# Patient Record
Sex: Female | Born: 1975
Health system: Southern US, Community
[De-identification: ages and names within clinical notes are randomized; demographics above are authoritative.]

## PROBLEM LIST (undated history)

## (undated) DIAGNOSIS — R519 Headache, unspecified: Secondary | ICD-10-CM

## (undated) DIAGNOSIS — R51 Headache: Secondary | ICD-10-CM

## (undated) DIAGNOSIS — M4800 Spinal stenosis, site unspecified: Secondary | ICD-10-CM

## (undated) HISTORY — PX: MOUTH SURGERY: SHX715

## (undated) HISTORY — DX: Spinal stenosis, site unspecified: M48.00

---

## 2003-11-30 ENCOUNTER — Other Ambulatory Visit: Admission: RE | Admit: 2003-11-30 | Discharge: 2003-11-30 | Payer: Self-pay | Admitting: Obstetrics & Gynecology

## 2003-11-30 ENCOUNTER — Other Ambulatory Visit: Admission: RE | Admit: 2003-11-30 | Discharge: 2003-11-30 | Payer: Self-pay | Admitting: Gastroenterology

## 2004-12-06 ENCOUNTER — Other Ambulatory Visit: Admission: RE | Admit: 2004-12-06 | Discharge: 2004-12-06 | Payer: Self-pay | Admitting: Obstetrics & Gynecology

## 2004-12-07 ENCOUNTER — Other Ambulatory Visit: Admission: RE | Admit: 2004-12-07 | Discharge: 2004-12-07 | Payer: Self-pay | Admitting: Obstetrics & Gynecology

## 2005-12-12 ENCOUNTER — Other Ambulatory Visit: Admission: RE | Admit: 2005-12-12 | Discharge: 2005-12-12 | Payer: Self-pay | Admitting: Obstetrics & Gynecology

## 2009-04-28 ENCOUNTER — Encounter: Admission: RE | Admit: 2009-04-28 | Discharge: 2009-04-28 | Payer: Self-pay | Admitting: Internal Medicine

## 2012-03-21 ENCOUNTER — Emergency Department (HOSPITAL_COMMUNITY)
Admission: EM | Admit: 2012-03-21 | Discharge: 2012-03-21 | Disposition: A | Discharge status: Home / Self Care | Payer: BC Managed Care – PPO | Attending: Emergency Medicine | Admitting: Emergency Medicine

## 2012-03-21 ENCOUNTER — Encounter (HOSPITAL_COMMUNITY): Payer: Self-pay | Admitting: *Deleted

## 2012-03-21 DIAGNOSIS — IMO0001 Reserved for inherently not codable concepts without codable children: Secondary | ICD-10-CM | POA: Insufficient documentation

## 2012-03-21 DIAGNOSIS — R51 Headache: Secondary | ICD-10-CM | POA: Insufficient documentation

## 2012-03-21 DIAGNOSIS — R5383 Other fatigue: Secondary | ICD-10-CM | POA: Insufficient documentation

## 2012-03-21 DIAGNOSIS — R42 Dizziness and giddiness: Secondary | ICD-10-CM | POA: Insufficient documentation

## 2012-03-21 DIAGNOSIS — I959 Hypotension, unspecified: Secondary | ICD-10-CM | POA: Insufficient documentation

## 2012-03-21 DIAGNOSIS — R5381 Other malaise: Secondary | ICD-10-CM | POA: Insufficient documentation

## 2012-03-21 HISTORY — DX: Headache, unspecified: R51.9

## 2012-03-21 HISTORY — DX: Headache: R51

## 2012-03-21 LAB — CBC WITH DIFFERENTIAL/PLATELET
Basophils Absolute: 0 10*3/uL (ref 0.0–0.1)
Eosinophils Relative: 1 % (ref 0–5)
HCT: 39.4 % (ref 36.0–46.0)
Hemoglobin: 12.9 g/dL (ref 12.0–15.0)
Lymphocytes Relative: 34 % (ref 12–46)
MCHC: 32.7 g/dL (ref 30.0–36.0)
MCV: 84.5 fL (ref 78.0–100.0)
Monocytes Absolute: 0.6 10*3/uL (ref 0.1–1.0)
Monocytes Relative: 9 % (ref 3–12)
Neutro Abs: 3.6 10*3/uL (ref 1.7–7.7)
RDW: 13.8 % (ref 11.5–15.5)
WBC: 6.5 10*3/uL (ref 4.0–10.5)

## 2012-03-21 LAB — URINALYSIS, ROUTINE W REFLEX MICROSCOPIC
Bilirubin Urine: NEGATIVE
Glucose, UA: NEGATIVE mg/dL
Ketones, ur: NEGATIVE mg/dL
Specific Gravity, Urine: 1.024 (ref 1.005–1.030)
pH: 7 (ref 5.0–8.0)

## 2012-03-21 LAB — POCT I-STAT, CHEM 8
BUN: 5 mg/dL — ABNORMAL LOW (ref 6–23)
Calcium, Ion: 1.25 mmol/L — ABNORMAL HIGH (ref 1.12–1.23)
Chloride: 103 mEq/L (ref 96–112)
Creatinine, Ser: 1 mg/dL (ref 0.50–1.10)
Glucose, Bld: 104 mg/dL — ABNORMAL HIGH (ref 70–99)
HCT: 42 % (ref 36.0–46.0)
Potassium: 3.6 mEq/L (ref 3.5–5.1)

## 2012-03-21 LAB — URINE MICROSCOPIC-ADD ON

## 2012-03-21 MED ORDER — IBUPROFEN 800 MG PO TABS
800.0000 mg | ORAL_TABLET | Freq: Once | ORAL | Status: AC
  Administered 2012-03-21: 800 mg via ORAL
  Filled 2012-03-21: qty 1

## 2012-03-21 NOTE — ED Notes (Signed)
Pt given something to drink and crackers to eat prior to receiving ibuprofen.

## 2012-03-21 NOTE — ED Notes (Signed)
Pt sent here from [facility] with Hypotension, states she has had a headache since Sunday with some generalized weakness, cramping and worsening head ache this morning

## 2012-03-21 NOTE — ED Notes (Signed)
Pt sent here from [facility] bc of hypotension, dizziness, weakness in hands, and headache since Sunday. States that Dr. Charline Bills that further testing was needed.

## 2012-03-21 NOTE — ED Provider Notes (Signed)
History     CSN: [id number]  Arrival date & time 03/21/12  1331   First MD Initiated Contact with Patient 03/21/12 1400      Chief Complaint  Patient presents with  . Hypotension  . Dizziness  . Headache    (Consider location/radiation/quality/duration/timing/severity/associated sxs/prior treatment) HPI Comments: Patient with no significant PMH, presents with multiple complaints. She has had HA for 4 days that is located in the occiput and is dull, does not radiate, is not associated with photo/phonophobia or fever. She has had nausea but no vomiting. No trouble moving neck. She also complains of intermittent lightheadedness. She has not passed out. Sometimes she sees 'spots'. She has been drinking normally. She also has had calf and forearm pain bilaterally. She has had episodes of this in the past but does not recall for how long. She states that she feels weak in her arms. She has taken 'generic headache reliever' without relief of symptoms. Onset gradual, course is constant. Patient went to the [facility] today and was sent to ED when her blood pressure was found to be low. No N/V/D, blood in urine or stool, heavy vaginal bleeding.   Patient is a 36 y.o. female presenting with headaches. The history is provided by the patient.  Headache  This is a new problem. The current episode started more than 2 days ago. The problem occurs constantly. The problem has not changed since onset.The pain is located in the occipital region. The quality of the pain is described as dull. The pain is moderate. The pain does not radiate. Pertinent negatives include no fever, no nausea and no vomiting.    Past Medical History  Diagnosis Date  . Worsening headaches     History reviewed. No pertinent past surgical history.  No family history on file.  History  Substance Use Topics  . Smoking status: Never Smoker   . Smokeless tobacco: Not on file  . Alcohol Use: No    OB History    Grav Para  Term Preterm Abortions TAB SAB Ect Mult Living                  Review of Systems  Constitutional: Negative for fever.  HENT: Negative for sore throat and rhinorrhea.   Eyes: Negative for redness.  Respiratory: Negative for cough.   Cardiovascular: Negative for chest pain.  Gastrointestinal: Negative for nausea, vomiting, abdominal pain and diarrhea.  Genitourinary: Negative for dysuria, hematuria and vaginal bleeding.  Musculoskeletal: Positive for myalgias. Negative for arthralgias.  Skin: Negative for rash.  Neurological: Positive for weakness, light-headedness and headaches. Negative for syncope.    Allergies  Review of patient's allergies indicates no known allergies.  Home Medications  No current outpatient prescriptions on file.  BP 117/67  Pulse 112  Temp 98.5 F (36.9 C) (Oral)  Resp 15  SpO2 98%  LMP 03/04/2012  Physical Exam  Nursing note and vitals reviewed. Constitutional: She appears well-developed and well-nourished.  HENT:  Head: Normocephalic and atraumatic.  Eyes: Conjunctivae are normal. Right eye exhibits no discharge. Left eye exhibits no discharge.  Neck: Normal range of motion. Neck supple.  Cardiovascular: Normal rate, regular rhythm and normal heart sounds.   No murmur heard. Pulmonary/Chest: Effort normal and breath sounds normal. No respiratory distress. She has no wheezes.  Abdominal: Soft. She exhibits no distension. There is no tenderness. There is no rebound and no guarding.  Musculoskeletal: Normal range of motion. She exhibits tenderness. She exhibits no edema.  Diffuse tenderness to palpation of forearms and calves. Sensation normal.   Neurological: She is alert. No cranial nerve deficit or sensory deficit. She exhibits normal muscle tone. Coordination normal.  Skin: Skin is warm and dry.  Psychiatric: She has a normal mood and affect.    ED Course  Procedures (including critical care time)  Labs Reviewed  URINALYSIS,  ROUTINE W REFLEX MICROSCOPIC - Abnormal; Notable for the following:    APPearance CLOUDY (*)     Hgb urine dipstick SMALL (*)     All other components within normal limits  POCT I-STAT, CHEM 8 - Abnormal; Notable for the following:    BUN 5 (*)     Glucose, Bld 104 (*)     Calcium, Ion 1.25 (*)     All other components within normal limits  CBC WITH DIFFERENTIAL  POCT PREGNANCY, URINE  URINE MICROSCOPIC-ADD ON   No results found.   1. Lightheadedness   2. HA (headache)     2:27 PM Patient seen and examined. Work-up initiated.    Vital signs reviewed and are as follows: Filed Vitals:   03/21/12 1346  BP: 117/67  Pulse: 112  Temp: 98.5 F (36.9 C)  Resp: 15   4:18 PM Results and history reviewed with Dr. Juleen China. Patient informed of results. She appears well. Her HA continues, ibuprofen ordered. She has PCP follow-up. Urged PCP follow-up with return if worsening. She verbalizes understanding and agrees with plan.   MDM  HA: no head injury, neurological exam is unconcerning. HA is not thunderclap or worst of life. Do not suspect intracranial etiology. Treat conservatively for now, follow-up with PCP if persistent or recurring.  Hypotension: not demonstrated in ED. Not orthostatic.   Lightheadedness: no clinical dehydration, symptoms mild, no sudden onset or palpitations, do not suspect cardiac etiology or arrhythmia.   Myalgias: unclear etiology. Strength intact. No viral syndrome. Patient to follow-up with PCP, especially since she has had multiple similar episodes in the past.   No life-threatening or dangerous medical conditions suspected at this time. Patient is stable at time of discharge and appears well.       Renne Crigler, [place] 03/21/12 [phone]

## 2012-03-25 NOTE — ED Provider Notes (Signed)
Medical screening examination/treatment/procedure(s) were performed by non-physician practitioner and as supervising physician I was immediately available for consultation/collaboration.    Chanoch Mccleery R Fawnda Vitullo, MD 03/25/12 1406 

## 2015-01-26 ENCOUNTER — Other Ambulatory Visit: Payer: Self-pay | Admitting: Family Medicine

## 2015-01-26 DIAGNOSIS — R5383 Other fatigue: Secondary | ICD-10-CM

## 2015-02-01 ENCOUNTER — Ambulatory Visit
Admission: RE | Admit: 2015-02-01 | Discharge: 2015-02-01 | Disposition: A | Discharge status: Home / Self Care | Payer: BC Managed Care – PPO | Source: Ambulatory Visit | Attending: Family Medicine | Admitting: Family Medicine

## 2015-02-01 DIAGNOSIS — R5383 Other fatigue: Secondary | ICD-10-CM

## 2017-04-06 ENCOUNTER — Other Ambulatory Visit: Payer: Self-pay | Admitting: Obstetrics & Gynecology

## 2017-04-06 DIAGNOSIS — R928 Other abnormal and inconclusive findings on diagnostic imaging of breast: Secondary | ICD-10-CM

## 2017-04-12 ENCOUNTER — Ambulatory Visit
Admission: RE | Admit: 2017-04-12 | Discharge: 2017-04-12 | Disposition: A | Discharge status: Home / Self Care | Payer: BC Managed Care – PPO | Source: Ambulatory Visit | Attending: Obstetrics & Gynecology | Admitting: Obstetrics & Gynecology

## 2017-04-12 DIAGNOSIS — R928 Other abnormal and inconclusive findings on diagnostic imaging of breast: Secondary | ICD-10-CM

## 2017-08-06 ENCOUNTER — Encounter (INDEPENDENT_AMBULATORY_CARE_PROVIDER_SITE_OTHER): Payer: Self-pay

## 2017-08-06 ENCOUNTER — Encounter: Payer: Self-pay | Admitting: Neurology

## 2017-08-06 ENCOUNTER — Ambulatory Visit: Payer: BC Managed Care – PPO | Admitting: Neurology

## 2017-08-06 VITALS — BP 102/68 | HR 90 | Ht 65.0 in | Wt 252.2 lb

## 2017-08-06 DIAGNOSIS — G43709 Chronic migraine without aura, not intractable, without status migrainosus: Secondary | ICD-10-CM | POA: Diagnosis not present

## 2017-08-06 DIAGNOSIS — IMO0002 Reserved for concepts with insufficient information to code with codable children: Secondary | ICD-10-CM | POA: Insufficient documentation

## 2017-08-06 MED ORDER — RIZATRIPTAN BENZOATE 10 MG PO TBDP: 10.0000 mg | ORAL_TABLET | ORAL | 11 refills | Status: AC | PRN

## 2017-08-06 MED ORDER — ONDANSETRON 4 MG PO TBDP: 4.0000 mg | ORAL_TABLET | Freq: Three times a day (TID) | ORAL | 11 refills | Status: AC | PRN

## 2017-08-06 NOTE — Progress Notes (Signed)
PATIENT: Vanessa Stafford DOB: 09/23/1975  Chief Complaint  Patient presents with  . Migraine    Reports frequent headaches often associated with dizziness, nausea, vomiting and sometimes weakness.  At times, she will wake up with the pain. She has between 3-4 events each month.  She has only had minimal relief with OTC NSAIDS, cyclobenzaprine and nabumetone.  She is currently only using meclizine and Extra Strength Tylenol prn.  Marland Kitchen. PCP    Rankins, Fanny DanceVictoria R, MD     HISTORICAL  Vanessa Stafford is a 41 years old female, seen in refer by her primary care doctor Rankins, [place] R for evaluation of migraine, initial evaluation was on August 06 2017    she reported a history of migraine headaches since high school, her typical migraine are lateralized severe pounding headache with associated light noise sensitivity, lasting for a few hours, increased frequency since 2018, about 4 times each months, trigger for her headaches are menstruation weather change stress, sleep deprivation,   She tried over-the-counter Tylenol or ibuprofen with limited help, was getting prescription of Flexeril, nabumetone, she could not tolerate it. She has never tried triptan in the past,    Laboratory evaluations, LDL was 148, cholesterol was 227, normal CBC, hemoglobin of 12.2, TSH,  CT head without contrast on Feb 01 2015 was no acute abnormalities  REVIEW OF SYSTEMS: Full 14 system review of systems performed and notable only for headache numbness weakness dizziness not enough sleep decreased energy disinteresting activities, feeling hot, joint pain, cramps, achy muscles, allergy, blurry vision, weight gain, fatigue, chest pain, palpitation, ringing ears, spinning sensation, rash, itching   ALLERGIES: No Known Allergies  HOME MEDICATIONS: Current Outpatient Medications  Medication Sig Dispense Refill  . Acetaminophen (TYLENOL EXTRA STRENGTH PO) Take as needed by mouth.    . meclizine (ANTIVERT) 25 MG  tablet Take 25 mg as needed by mouth.     No current facility-administered medications for this visit.     PAST MEDICAL HISTORY: Past Medical History:  Diagnosis Date  . Spinal stenosis   . Worsening headaches     PAST SURGICAL HISTORY: Past Surgical History:  Procedure Laterality Date  . MOUTH SURGERY      FAMILY HISTORY: Family History  Problem Relation Age of Onset  . Diabetes Mother   . Cataracts Mother   . Arthritis Father   . Hypertension Maternal Grandmother     SOCIAL HISTORY:  Social History   Socioeconomic History  . Marital status: Single    Spouse name: Not on file  . Number of children: 0  . Years of education: 16  . Highest education level: Bachelor's degree (e.g., BA, AB, BS)  Social Needs  . Financial resource strain: Not on file  . Food insecurity - worry: Not on file  . Food insecurity - inability: Not on file  . Transportation needs - medical: Not on file  . Transportation needs - non-medical: Not on file  Occupational History  . Occupation: Furniture conservator/restorerAdministrative Support  Tobacco Use  . Smoking status: Never Smoker  . Smokeless tobacco: Never Used  Substance and Sexual Activity  . Alcohol use: Yes    Comment: 1-2 drinks occasionally - social  . Drug use: No  . Sexual activity: Yes    Birth control/protection: None  Other Topics Concern  . Not on file  Social History Narrative   Lives at home alone.   Left-handed.   Occasionally use of caffeine.     PHYSICAL  EXAM   Vitals:   08/06/17 1509  BP: 102/68  Pulse: 90  Weight: 252 lb 4 oz (114.4 kg)  Height: 5\' 5"  (1.651 m)    Not recorded      Body mass index is 41.98 kg/m.  PHYSICAL EXAMNIATION:  Gen: NAD, conversant, well nourised, obese, well groomed                     Cardiovascular: Regular rate rhythm, no peripheral edema, warm, nontender. Eyes: Conjunctivae clear without exudates or hemorrhage Neck: Supple, no carotid bruits. Pulmonary: Clear to auscultation bilaterally    NEUROLOGICAL EXAM:  MENTAL STATUS: Speech:    Speech is normal; fluent and spontaneous with normal comprehension.  Cognition:     Orientation to time, place and person     Normal recent and remote memory     Normal Attention span and concentration     Normal Language, naming, repeating,spontaneous speech     Fund of knowledge   CRANIAL NERVES: CN II: Visual fields are full to confrontation. Fundoscopic exam is normal with sharp discs and no vascular changes. Pupils are round equal and briskly reactive to light. CN III, IV, VI: extraocular movement are normal. No ptosis. CN V: Facial sensation is intact to pinprick in all 3 divisions bilaterally. Corneal responses are intact.  CN VII: Face is symmetric with normal eye closure and smile. CN VIII: Hearing is normal to rubbing fingers CN IX, X: Palate elevates symmetrically. Phonation is normal. CN XI: Head turning and shoulder shrug are intact CN XII: Tongue is midline with normal movements and no atrophy.  MOTOR: There is no pronator drift of out-stretched arms. Muscle bulk and tone are normal. Muscle strength is normal.  REFLEXES: Reflexes are 2+ and symmetric at the biceps, triceps, knees, and ankles. Plantar responses are flexor.  SENSORY: Intact to light touch, pinprick, positional sensation and vibratory sensation are intact in fingers and toes.  COORDINATION: Rapid alternating movements and fine finger movements are intact. There is no dysmetria on finger-to-nose and heel-knee-shin.    GAIT/STANCE: Posture is normal. Gait is steady with normal steps, base, arm swing, and turning. Heel and toe walking are normal. Tandem gait is normal.  Romberg is absent.   DIAGNOSTIC DATA (LABS, IMAGING, TESTING) - I reviewed patient records, labs, notes, testing and imaging myself where available.   ASSESSMENT AND PLAN  Vanessa Stafford is a 41 y.o. female    Chronic migraine headaches   She does not want to go on preventive  medications  Maxalt 10 mg as needed  Zofran 4 mg dissolvable as needed    Levert FeinsteinYijun Jerian Morais, M.D. Ph.D.  [facility] [address], [address] [place], [place] [address] Ph: [phone]) [phone] Fax: [phone]  CC: Rankins, Fanny DanceVictoria R, MD

## 2017-10-29 ENCOUNTER — Ambulatory Visit: Payer: BC Managed Care – PPO | Admitting: Neurology

## 2017-10-29 ENCOUNTER — Encounter: Payer: Self-pay | Admitting: Neurology

## 2017-10-29 VITALS — BP 117/72 | HR 95 | Ht 65.0 in | Wt 255.0 lb

## 2017-10-29 DIAGNOSIS — G43709 Chronic migraine without aura, not intractable, without status migrainosus: Secondary | ICD-10-CM

## 2017-10-29 DIAGNOSIS — IMO0002 Reserved for concepts with insufficient information to code with codable children: Secondary | ICD-10-CM

## 2017-10-29 NOTE — Progress Notes (Signed)
PATIENT: Vanessa Stafford DOB: 09/21/1975  Chief Complaint  Patient presents with  . Migraine    She has noticed an increase in migraines, having at least one per week. She always gets one around her menstrual cycle. She is overwhelmed/stressed at work. Rizatriptan is helpful and twelve tablets has lasted her three months.       HISTORICAL  Vanessa Axendrea L Ansell is a 42 years old female, seen in refer by her primary care doctor Rankins, [place] R for evaluation of migraine, initial evaluation was on August 06 2017    she reported a history of migraine headaches since high school, her typical migraine are lateralized severe pounding headache with associated light noise sensitivity, lasting for a few hours, increased frequency since 2018, about 4 times each months, trigger for her headaches are menstruation weather change stress, sleep deprivation,   She tried over-the-counter Tylenol or ibuprofen with limited help, was getting prescription of Flexeril, nabumetone, she could not tolerate it. She has never tried triptan in the past,   Laboratory evaluations, LDL was 148, cholesterol was 227, normal CBC, hemoglobin of 12.2, TSH,  CT head without contrast on Feb 01 2015 was no acute abnormalities  UPDATE Oct 29 2017: She felt overwhelmed all the time, she works [employer], complains of excessive stress, Maxalt has helped her headache,  REVIEW OF SYSTEMS: Full 14 system review of systems performed and notable only for fatigue, constipation, headaches, joint pain, back pain, neck pain  ALLERGIES: No Known Allergies  HOME MEDICATIONS: Current Outpatient Medications  Medication Sig Dispense Refill  . Acetaminophen (TYLENOL EXTRA STRENGTH PO) Take as needed by mouth.    . meclizine (ANTIVERT) 25 MG tablet Take 25 mg as needed by mouth.    . ondansetron (ZOFRAN ODT) 4 MG disintegrating tablet Take 1 tablet (4 mg total) every 8 (eight) hours as needed by mouth. 20 tablet 11  . rizatriptan  (MAXALT-MLT) 10 MG disintegrating tablet Take 1 tablet (10 mg total) as needed by mouth. May repeat in 2 hours if needed 12 tablet 11   No current facility-administered medications for this visit.     PAST MEDICAL HISTORY: Past Medical History:  Diagnosis Date  . Spinal stenosis   . Worsening headaches     PAST SURGICAL HISTORY: Past Surgical History:  Procedure Laterality Date  . MOUTH SURGERY      FAMILY HISTORY: Family History  Problem Relation Age of Onset  . Diabetes Mother   . Cataracts Mother   . Arthritis Father   . Hypertension Maternal Grandmother     SOCIAL HISTORY:  Social History   Socioeconomic History  . Marital status: Single    Spouse name: Not on file  . Number of children: 0  . Years of education: 16  . Highest education level: Bachelor's degree (e.g., BA, AB, BS)  Social Needs  . Financial resource strain: Not on file  . Food insecurity - worry: Not on file  . Food insecurity - inability: Not on file  . Transportation needs - medical: Not on file  . Transportation needs - non-medical: Not on file  Occupational History  . Occupation: Furniture conservator/restorerAdministrative Support  Tobacco Use  . Smoking status: Never Smoker  . Smokeless tobacco: Never Used  Substance and Sexual Activity  . Alcohol use: Yes    Comment: 1-2 drinks occasionally - social  . Drug use: No  . Sexual activity: Yes    Birth control/protection: None  Other Topics Concern  . Not  on file  Social History Narrative   Lives at home alone.   Left-handed.   Occasionally use of caffeine.     PHYSICAL EXAM   Vitals:   10/29/17 1531  BP: 117/72  Pulse: 95  Weight: 255 lb (115.7 kg)  Height: 5\' 5"  (1.651 m)    Not recorded      Body mass index is 42.43 kg/m.  PHYSICAL EXAMNIATION:  Gen: NAD, conversant, well nourised, obese, well groomed                     Cardiovascular: Regular rate rhythm, no peripheral edema, warm, nontender. Eyes: Conjunctivae clear without exudates or  hemorrhage Neck: Supple, no carotid bruits. Pulmonary: Clear to auscultation bilaterally   NEUROLOGICAL EXAM:  MENTAL STATUS: Speech:    Speech is normal; fluent and spontaneous with normal comprehension.  Cognition:     Orientation to time, place and person     Normal recent and remote memory     Normal Attention span and concentration     Normal Language, naming, repeating,spontaneous speech     Fund of knowledge   CRANIAL NERVES: CN II: Visual fields are full to confrontation. Fundoscopic exam is normal with sharp discs and no vascular changes. Pupils are round equal and briskly reactive to light. CN III, IV, VI: extraocular movement are normal. No ptosis. CN V: Facial sensation is intact to pinprick in all 3 divisions bilaterally. Corneal responses are intact.  CN VII: Face is symmetric with normal eye closure and smile. CN VIII: Hearing is normal to rubbing fingers CN IX, X: Palate elevates symmetrically. Phonation is normal. CN XI: Head turning and shoulder shrug are intact CN XII: Tongue is midline with normal movements and no atrophy.  MOTOR: There is no pronator drift of out-stretched arms. Muscle bulk and tone are normal. Muscle strength is normal.  REFLEXES: Reflexes are 2+ and symmetric at the biceps, triceps, knees, and ankles. Plantar responses are flexor.  SENSORY: Intact to light touch, pinprick, positional sensation and vibratory sensation are intact in fingers and toes.  COORDINATION: Rapid alternating movements and fine finger movements are intact. There is no dysmetria on finger-to-nose and heel-knee-shin.    GAIT/STANCE: Posture is normal. Gait is steady with normal steps, base, arm swing, and turning. Heel and toe walking are normal. Tandem gait is normal.  Romberg is absent.   DIAGNOSTIC DATA (LABS, IMAGING, TESTING) - I reviewed patient records, labs, notes, testing and imaging myself where available.   ASSESSMENT AND PLAN  REJEANA FADNESS is a  42 y.o. female    Chronic migraine headaches   She does not want to go on preventive medications  Maxalt 10 mg as needed works well for her.  Zofran 4 mg dissolvable as needed    Levert Feinstein, M.D. Ph.D.  [facility] [address] [place], [place] [address] Ph: [phone] Fax: [phone]  CC: Rankins, Fanny Dance, MD

## 2017-11-08 ENCOUNTER — Ambulatory Visit: Payer: BC Managed Care – PPO | Admitting: Neurology

## 2019-11-12 ENCOUNTER — Other Ambulatory Visit: Payer: Self-pay | Admitting: Family Medicine

## 2019-11-12 ENCOUNTER — Other Ambulatory Visit (HOSPITAL_COMMUNITY)
Admission: RE | Admit: 2019-11-12 | Discharge: 2019-11-12 | Disposition: A | Discharge status: Home / Self Care | Payer: 59 | Source: Ambulatory Visit | Attending: Family Medicine | Admitting: Family Medicine

## 2019-11-12 DIAGNOSIS — Z124 Encounter for screening for malignant neoplasm of cervix: Secondary | ICD-10-CM | POA: Insufficient documentation

## 2019-11-13 ENCOUNTER — Other Ambulatory Visit: Payer: Self-pay | Admitting: Family Medicine

## 2019-11-13 ENCOUNTER — Ambulatory Visit
Admission: RE | Admit: 2019-11-13 | Discharge: 2019-11-13 | Disposition: A | Discharge status: Home / Self Care | Payer: 59 | Source: Ambulatory Visit | Attending: Family Medicine | Admitting: Family Medicine

## 2019-11-13 ENCOUNTER — Other Ambulatory Visit: Payer: Self-pay

## 2019-11-13 DIAGNOSIS — Z1231 Encounter for screening mammogram for malignant neoplasm of breast: Secondary | ICD-10-CM

## 2019-11-14 ENCOUNTER — Other Ambulatory Visit: Payer: Self-pay | Admitting: Family Medicine

## 2019-11-14 ENCOUNTER — Ambulatory Visit: Payer: BC Managed Care – PPO

## 2019-11-14 DIAGNOSIS — R928 Other abnormal and inconclusive findings on diagnostic imaging of breast: Secondary | ICD-10-CM

## 2019-11-14 LAB — CYTOLOGY - PAP
Comment: NEGATIVE
Diagnosis: NEGATIVE
High risk HPV: NEGATIVE

## 2019-11-26 ENCOUNTER — Other Ambulatory Visit: Payer: Self-pay

## 2019-11-26 ENCOUNTER — Ambulatory Visit
Admission: RE | Admit: 2019-11-26 | Discharge: 2019-11-26 | Disposition: A | Discharge status: Home / Self Care | Payer: 59 | Source: Ambulatory Visit | Attending: Family Medicine | Admitting: Family Medicine

## 2019-11-26 DIAGNOSIS — R928 Other abnormal and inconclusive findings on diagnostic imaging of breast: Secondary | ICD-10-CM

## 2020-10-14 ENCOUNTER — Other Ambulatory Visit: Payer: Self-pay | Admitting: Family Medicine

## 2020-10-14 DIAGNOSIS — Z1231 Encounter for screening mammogram for malignant neoplasm of breast: Secondary | ICD-10-CM

## 2020-11-30 ENCOUNTER — Inpatient Hospital Stay: Admission: RE | Admit: 2020-11-30 | Payer: 59 | Source: Ambulatory Visit

## 2020-12-09 ENCOUNTER — Ambulatory Visit
Admission: RE | Admit: 2020-12-09 | Discharge: 2020-12-09 | Disposition: A | Discharge status: Home / Self Care | Payer: BC Managed Care – PPO | Source: Ambulatory Visit | Attending: Family Medicine | Admitting: Family Medicine

## 2020-12-09 ENCOUNTER — Other Ambulatory Visit: Payer: Self-pay

## 2020-12-09 DIAGNOSIS — Z1231 Encounter for screening mammogram for malignant neoplasm of breast: Secondary | ICD-10-CM

## 2021-07-29 ENCOUNTER — Other Ambulatory Visit: Payer: Self-pay

## 2021-07-29 ENCOUNTER — Ambulatory Visit: Payer: BC Managed Care – PPO | Admitting: Cardiology

## 2021-07-29 ENCOUNTER — Encounter: Payer: Self-pay | Admitting: Cardiology

## 2021-07-29 VITALS — BP 110/82 | HR 97 | Ht 65.0 in | Wt 278.0 lb

## 2021-07-29 DIAGNOSIS — Z79899 Other long term (current) drug therapy: Secondary | ICD-10-CM

## 2021-07-29 DIAGNOSIS — R079 Chest pain, unspecified: Secondary | ICD-10-CM

## 2021-07-29 DIAGNOSIS — E782 Mixed hyperlipidemia: Secondary | ICD-10-CM

## 2021-07-29 DIAGNOSIS — R0789 Other chest pain: Secondary | ICD-10-CM

## 2021-07-29 DIAGNOSIS — R5383 Other fatigue: Secondary | ICD-10-CM

## 2021-07-29 DIAGNOSIS — R7303 Prediabetes: Secondary | ICD-10-CM

## 2021-07-29 DIAGNOSIS — R002 Palpitations: Secondary | ICD-10-CM | POA: Diagnosis not present

## 2021-07-29 MED ORDER — METOPROLOL TARTRATE 100 MG PO TABS: ORAL_TABLET | ORAL | 0 refills | Status: AC

## 2021-07-29 MED ORDER — ROSUVASTATIN CALCIUM 5 MG PO TABS: 5.0000 mg | ORAL_TABLET | Freq: Every day | ORAL | 3 refills | Status: DC

## 2021-07-29 NOTE — Patient Instructions (Addendum)
Medication Instructions:  Your physician has recommended you make the following change in your medication:  START: Crestor 5 mg once daily *If you need a refill on your cardiac medications before your next appointment, please call your pharmacy*   Lab Work: Your physician recommends that you return for lab work in:  Today: Lipids, Lp(a), Vitamin D, HbgA1C 3-7 days before CT scan: BMET, Mag If you have labs (blood work) drawn today and your tests are completely normal, you will receive your results only by: MyChart Message (if you have MyChart) OR A paper copy in the mail If you have any lab test that is abnormal or we need to change your treatment, we will call you to review the results.   Testing/Procedures:   Your cardiac CT will be scheduled at one of the below locations:   [facility] [address] [place], [place] [address] [phone]  If scheduled at [facility], please arrive at the [facility] main entrance (entrance A) of [facility] 30 minutes prior to test start time. You can use the FREE valet parking offered at the main entrance (encouraged to control the heart rate for the test) Proceed to the [facility] Radiology Department (first floor) to check-in and test prep.  Please follow these instructions carefully (unless otherwise directed):   On the Night Before the Test: Be sure to Drink plenty of water. Do not consume any caffeinated/decaffeinated beverages or chocolate 12 hours prior to your test. Do not take any antihistamines 12 hours prior to your test.   On the Day of the Test: Drink plenty of water until 1 hour prior to the test. Do not eat any food 4 hours prior to the test. You may take your regular medications prior to the test.  Take metoprolol (Lopressor) two hours prior to test. FEMALES- please wear underwire-free bra if available, avoid dresses & tight clothing      After the Test: Drink plenty of water. After  receiving IV contrast, you may experience a mild flushed feeling. This is normal. On occasion, you may experience a mild rash up to 24 hours after the test. This is not dangerous. If this occurs, you can take Benadryl 25 mg and increase your fluid intake. If you experience trouble breathing, this can be serious. If it is severe call 911 IMMEDIATELY. If it is mild, please call our office. If you take any of these medications: Glipizide/Metformin, Avandament, Glucavance, please do not take 48 hours after completing test unless otherwise instructed.  Please allow 2-4 weeks for scheduling of routine cardiac CTs. Some insurance companies require a pre-authorization which may delay scheduling of this test.   For non-scheduling related questions, please contact the cardiac imaging nurse navigator should you have any questions/concerns: Rockwell Alexandria, Cardiac Imaging Nurse Navigator Larey Brick, Cardiac Imaging Nurse Navigator [facility] Direct Office Dial: [phone]   For scheduling needs, including cancellations and rescheduling, please call [place], [phone].  ZIO XT- Long Term Monitor Instructions  Your physician has requested you wear a ZIO patch monitor for 7 days.  This is a single patch monitor. [employer] supplies one patch monitor per enrollment. Additional stickers are not available. Please do not apply patch if you will be having a Nuclear Stress Test,  Echocardiogram, Cardiac CT, MRI, or Chest Xray during the period you would be wearing the  monitor. The patch cannot be worn during these tests. You cannot remove and re-apply the  ZIO  XT patch monitor.  Your ZIO patch monitor will be mailed 3 day USPS to your address on file. It may take 3-5 days  to receive your monitor after you have been enrolled.  Once you have received your monitor, please review the enclosed instructions. Your monitor  has already been registered assigning a specific monitor  serial # to you.  Billing and Patient Assistance Program Information  We have supplied [employer] with any of your insurance information on file for billing purposes. [employer] offers a sliding scale Patient Assistance Program for patients that do not have  insurance, or whose insurance does not completely cover the cost of the ZIO monitor.  You must apply for the Patient Assistance Program to qualify for this discounted rate.  To apply, please call [employer] at [phone], select option 4, select option 2, ask to apply for  Patient Assistance Program. Meredeth Ide will ask your household income, and how many people  are in your household. They will quote your out-of-pocket cost based on that information.  [employer] will also be able to set up a 21-month, interest-free payment plan if needed.  Applying the monitor   Shave hair from upper left chest.  Hold abrader disc by orange tab. Rub abrader in 40 strokes over the upper left chest as  indicated in your monitor instructions.  Clean area with 4 enclosed alcohol pads. Let dry.  Apply patch as indicated in monitor instructions. Patch will be placed under collarbone on left  side of chest with arrow pointing upward.  Rub patch adhesive wings for 2 minutes. Remove white label marked "1". Remove the white  label marked "2". Rub patch adhesive wings for 2 additional minutes.  While looking in a mirror, press and release button in center of patch. A small green light will  flash 3-4 times. This will be your only indicator that the monitor has been turned on.  Do not shower for the first 24 hours. You may shower after the first 24 hours.  Press the button if you feel a symptom. You will hear a small click. Record Date, Time and  Symptom in the Patient Logbook.  When you are ready to remove the patch, follow instructions on the last 2 pages of Patient  Logbook. Stick patch monitor onto the last page of Patient Logbook.  Place Patient Logbook in the blue  and white box. Use locking tab on box and tape box closed  securely. The blue and white box has prepaid postage on it. Please place it in the mailbox as  soon as possible. Your physician should have your test results approximately 7 days after the  monitor has been mailed back to [facility].  Call [facility] Customer Care at [phone] if you have questions regarding  your ZIO XT patch monitor. Call them immediately if you see an orange light blinking on your  monitor.  If your monitor falls off in less than 4 days, contact our Monitor department at [phone].  If your monitor becomes loose or falls off after 4 days call [employer] at [phone] for  suggestions on securing your monitor   Follow-Up: At [facility], you and your health needs are our priority.  As part of our continuing mission to provide you with exceptional heart care, we have created designated Provider Care Teams.  These Care Teams include your primary Cardiologist (physician) and Advanced Practice Providers (APPs -  Physician Assistants and Nurse Practitioners) who all work together to provide you with the care  you need, when you need it.  We recommend signing up for the patient portal called "MyChart".  Sign up information is provided on this After Visit Summary.  MyChart is used to connect with patients for Virtual Visits (Telemedicine).  Patients are able to view lab/test results, encounter notes, upcoming appointments, etc.  Non-urgent messages can be sent to your provider as well.   To learn more about what you can do with MyChart, go to [email or web address].    Your next appointment:   4 month(s)  The format for your next appointment:   In Person  Provider:   Thomasene Ripple, DO     Other Instructions

## 2021-07-29 NOTE — Progress Notes (Signed)
Cardiology Office Note:    Date:  07/29/2021   ID:  Vanessa Stafford, DOB Jul 28, 1976, MRN [id number]  PCP:  Clayborn Heron, MD  Cardiologist:  Thomasene Ripple, DO  Electrophysiologist:  None   Referring MD: Clayborn Heron, MD   " I have been having chest discomfort  History of Present Illness:    Vanessa Stafford is a 45 y.o. female with a hx of hyperlipidemia, morbid obesity, prediabetes most recent hemoglobin A1c in March 2022 6.0 is here today to be evaluated for chest discomfort and palpitations.  Patient tells me that over the last several months she has been experiencing intermittent midsternal chest pain.  She described as a pressure-like sensation.  She denies any radiation.  But she tells me when this happens she feels sometimes sensation in her arms.  Of recent she explained the situation to me where she was having the symptoms but she got significant fatigue and was unable to drive home to [place] where she is found given the symptoms.  She has never had any type of work-up for this.  The progressiveness and recurrence of this makes her very concerned given her family history.  Past Medical History:  Diagnosis Date   Spinal stenosis    Worsening headaches     Past Surgical History:  Procedure Laterality Date   MOUTH SURGERY      Current Medications: Current Meds  Medication Sig   Acetaminophen (TYLENOL EXTRA STRENGTH PO) Take as needed by mouth.   metoprolol tartrate (LOPRESSOR) 100 MG tablet Take 2 hours prior to CT   rosuvastatin (CRESTOR) 5 MG tablet Take 1 tablet (5 mg total) by mouth daily.     Allergies:   Nabumetone   Social History   Socioeconomic History   Marital status: Single    Spouse name: Not on file   Number of children: 0   Years of education: 16   Highest education level: Bachelor's degree (e.g., BA, AB, BS)  Occupational History   Occupation: Furniture conservator/restorer  Tobacco Use   Smoking status: Never   Smokeless tobacco: Never   Vaping Use   Vaping Use: Never used  Substance and Sexual Activity   Alcohol use: Yes    Comment: 1-2 drinks occasionally - social   Drug use: No   Sexual activity: Yes    Birth control/protection: None  Other Topics Concern   Not on file  Social History Narrative   Lives at home alone.   Left-handed.   Occasionally use of caffeine.   Social Determinants of Health   Financial Resource Strain: Not on file  Food Insecurity: Not on file  Transportation Needs: Not on file  Physical Activity: Not on file  Stress: Not on file  Social Connections: Not on file     Family History: The patient's family history includes Arthritis in her father; Cataracts in her mother; Diabetes in her mother; Hypertension in her maternal grandmother. There is no history of Breast cancer.  ROS:   Review of Systems  Constitution: Negative for decreased appetite, fever and weight gain.  HENT: Negative for congestion, ear discharge, hoarse voice and sore throat.   Eyes: Negative for discharge, redness, vision loss in right eye and visual halos.  Cardiovascular: Reports r chest pain, dyspnea on exertion, and palpitation.  Negative for leg swelling, orthopnea.  Respiratory: Negative for cough, hemoptysis, shortness of breath and snoring.   Endocrine: Negative for heat intolerance and polyphagia.  Hematologic/Lymphatic: Negative for bleeding problem. Does  not bruise/bleed easily.  Skin: Negative for flushing, nail changes, rash and suspicious lesions.  Musculoskeletal: Negative for arthritis, joint pain, muscle cramps, myalgias, neck pain and stiffness.  Gastrointestinal: Negative for abdominal pain, bowel incontinence, diarrhea and excessive appetite.  Genitourinary: Negative for decreased libido, genital sores and incomplete emptying.  Neurological: Negative for brief paralysis, focal weakness, headaches and loss of balance.  Psychiatric/Behavioral: Negative for altered mental status, depression and  suicidal ideas.  Allergic/Immunologic: Negative for HIV exposure and persistent infections.    EKGs/Labs/Other Studies Reviewed:    The following studies were reviewed today:   EKG:  The ekg ordered today demonstrates sinus rhythm, heart rate  Recent Labs: No results found for requested labs within last 8760 hours.  Recent Lipid Panel    Component Value Date/Time   CHOL 223 (H) 07/29/2021 0932   TRIG 97 07/29/2021 0932   HDL 47 07/29/2021 0932   CHOLHDL 4.7 (H) 07/29/2021 0932   LDLCALC 159 (H) 07/29/2021 0932    Physical Exam:    VS:  BP 110/82 (BP Location: Right Arm)   Pulse 97   Ht 5\' 5"  (1.651 m)   Wt 278 lb (126.1 kg)   LMP 07/28/2021   SpO2 97%   BMI 46.26 kg/m     Wt Readings from Last 3 Encounters:  07/29/21 278 lb (126.1 kg)  10/29/17 255 lb (115.7 kg)  08/06/17 252 lb 4 oz (114.4 kg)     GEN: Well nourished, well developed in no acute distress HEENT: Normal NECK: No JVD; No carotid bruits LYMPHATICS: No lymphadenopathy CARDIAC: S1S2 noted,RRR, no murmurs, rubs, gallops RESPIRATORY:  Clear to auscultation without rales, wheezing or rhonchi  ABDOMEN: Soft, non-tender, non-distended, +bowel sounds, no guarding. EXTREMITIES: No edema, No cyanosis, no clubbing MUSCULOSKELETAL:  No deformity  SKIN: Warm and dry NEUROLOGIC:  Alert and oriented x 3, non-focal PSYCHIATRIC:  Normal affect, good insight  ASSESSMENT:    1. Chest pain of uncertain etiology   2. Mixed hyperlipidemia   3. Prediabetes   4. Palpitations   5. Medication management   6. Fatigue, unspecified type   7. Other chest pain    PLAN:     The symptoms chest pain is concerning, this patient does have intermediate risk for coronary artery disease and at this time I would like to pursue an ischemic evaluation in this patient.  Shared decision a coronary CTA at this time is appropriate.  I have discussed with the patient about the testing.  The patient has no IV contrast allergy and is  agreeable to proceed with this test. I also talked to the patient about diagnosis of prediabetes which she was not aware of.  I explained to the patient what this means with her cardiovascular health.  In addition we talked about her hyperlipidemia and she is willing to start Crestor 5 mg daily. She has had some fatigue we will get a vitamin D level. Repeat blood work for BMP, mag, lipid profile, hemoglobin A1c and LP(a). Insert weight loss The patient is in agreement with the above plan. The patient left the office in stable condition.  The patient will follow up in   Medication Adjustments/Labs and Tests Ordered: Current medicines are reviewed at length with the patient today.  Concerns regarding medicines are outlined above.  Orders Placed This Encounter  Procedures   CT CORONARY MORPH W/CTA COR W/SCORE W/CA W/CM &/OR WO/CM   Basic Metabolic Panel (BMET)   Magnesium   Lipid panel  HgB A1c   Lipoprotein A (LPA)   VITAMIN D 25 Hydroxy (Vit-D Deficiency, Fractures)   EKG 12-Lead    Meds ordered this encounter  Medications   metoprolol tartrate (LOPRESSOR) 100 MG tablet    Sig: Take 2 hours prior to CT    Dispense:  1 tablet    Refill:  0   rosuvastatin (CRESTOR) 5 MG tablet    Sig: Take 1 tablet (5 mg total) by mouth daily.    Dispense:  90 tablet    Refill:  3     Patient Instructions  Medication Instructions:  Your physician has recommended you make the following change in your medication:  START: Crestor 5 mg once daily *If you need a refill on your cardiac medications before your next appointment, please call your pharmacy*   Lab Work: Your physician recommends that you return for lab work in:  Today: Lipids, Lp(a), Vitamin D, HbgA1C 3-7 days before CT scan: BMET, Mag If you have labs (blood work) drawn today and your tests are completely normal, you will receive your results only by: MyChart Message (if you have MyChart) OR A paper copy in the mail If you have  any lab test that is abnormal or we need to change your treatment, we will call you to review the results.   Testing/Procedures:   Your cardiac CT will be scheduled at one of the below locations:   [facility] [address] [place], [place] [address] [phone]  If scheduled at [facility], please arrive at the [facility] main entrance (entrance A) of [facility] 30 minutes prior to test start time. You can use the FREE valet parking offered at the main entrance (encouraged to control the heart rate for the test) Proceed to the [facility] Radiology Department (first floor) to check-in and test prep.  Please follow these instructions carefully (unless otherwise directed):   On the Night Before the Test: Be sure to Drink plenty of water. Do not consume any caffeinated/decaffeinated beverages or chocolate 12 hours prior to your test. Do not take any antihistamines 12 hours prior to your test.   On the Day of the Test: Drink plenty of water until 1 hour prior to the test. Do not eat any food 4 hours prior to the test. You may take your regular medications prior to the test.  Take metoprolol (Lopressor) two hours prior to test. FEMALES- please wear underwire-free bra if available, avoid dresses & tight clothing      After the Test: Drink plenty of water. After receiving IV contrast, you may experience a mild flushed feeling. This is normal. On occasion, you may experience a mild rash up to 24 hours after the test. This is not dangerous. If this occurs, you can take Benadryl 25 mg and increase your fluid intake. If you experience trouble breathing, this can be serious. If it is severe call 911 IMMEDIATELY. If it is mild, please call our office. If you take any of these medications: Glipizide/Metformin, Avandament, Glucavance, please do not take 48 hours after completing test unless otherwise instructed.  Please allow 2-4 weeks for scheduling of  routine cardiac CTs. Some insurance companies require a pre-authorization which may delay scheduling of this test.   For non-scheduling related questions, please contact the cardiac imaging nurse navigator should you have any questions/concerns: Rockwell Alexandria, Cardiac Imaging Nurse Navigator Larey Brick, Cardiac Imaging Nurse Navigator [facility] Direct Office Dial: [phone]  For scheduling needs, including cancellations and rescheduling, please call [place], [phone].  ZIO XT- Long Term Monitor Instructions  Your physician has requested you wear a ZIO patch monitor for 7 days.  This is a single patch monitor. [employer] supplies one patch monitor per enrollment. Additional stickers are not available. Please do not apply patch if you will be having a Nuclear Stress Test,  Echocardiogram, Cardiac CT, MRI, or Chest Xray during the period you would be wearing the  monitor. The patch cannot be worn during these tests. You cannot remove and re-apply the  ZIO XT patch monitor.  Your ZIO patch monitor will be mailed 3 day USPS to your address on file. It may take 3-5 days  to receive your monitor after you have been enrolled.  Once you have received your monitor, please review the enclosed instructions. Your monitor  has already been registered assigning a specific monitor serial # to you.  Billing and Patient Assistance Program Information  We have supplied [employer] with any of your insurance information on file for billing purposes. [employer] offers a sliding scale Patient Assistance Program for patients that do not have  insurance, or whose insurance does not completely cover the cost of the ZIO monitor.  You must apply for the Patient Assistance Program to qualify for this discounted rate.  To apply, please call [employer] at [phone], select option 4, select option 2, ask to apply for  Patient Assistance Program. Meredeth Ide will ask your household income,  and how many people  are in your household. They will quote your out-of-pocket cost based on that information.  [employer] will also be able to set up a 69-month, interest-free payment plan if needed.  Applying the monitor   Shave hair from upper left chest.  Hold abrader disc by orange tab. Rub abrader in 40 strokes over the upper left chest as  indicated in your monitor instructions.  Clean area with 4 enclosed alcohol pads. Let dry.  Apply patch as indicated in monitor instructions. Patch will be placed under collarbone on left  side of chest with arrow pointing upward.  Rub patch adhesive wings for 2 minutes. Remove white label marked "1". Remove the white  label marked "2". Rub patch adhesive wings for 2 additional minutes.  While looking in a mirror, press and release button in center of patch. A small green light will  flash 3-4 times. This will be your only indicator that the monitor has been turned on.  Do not shower for the first 24 hours. You may shower after the first 24 hours.  Press the button if you feel a symptom. You will hear a small click. Record Date, Time and  Symptom in the Patient Logbook.  When you are ready to remove the patch, follow instructions on the last 2 pages of Patient  Logbook. Stick patch monitor onto the last page of Patient Logbook.  Place Patient Logbook in the blue and white box. Use locking tab on box and tape box closed  securely. The blue and white box has prepaid postage on it. Please place it in the mailbox as  soon as possible. Your physician should have your test results approximately 7 days after the  monitor has been mailed back to [facility].  Call [facility] Customer Care at [phone] if you have questions regarding  your ZIO XT patch monitor. Call them immediately if you see an orange light blinking on your  monitor.  If your monitor falls off in less  than 4 days, contact our Monitor department at [phone].  If your monitor  becomes loose or falls off after 4 days call [employer] at [phone] for  suggestions on securing your monitor   Follow-Up: At [facility], you and your health needs are our priority.  As part of our continuing mission to provide you with exceptional heart care, we have created designated Provider Care Teams.  These Care Teams include your primary Cardiologist (physician) and Advanced Practice Providers (APPs -  Physician Assistants and Nurse Practitioners) who all work together to provide you with the care you need, when you need it.  We recommend signing up for the patient portal called "MyChart".  Sign up information is provided on this After Visit Summary.  MyChart is used to connect with patients for Virtual Visits (Telemedicine).  Patients are able to view lab/test results, encounter notes, upcoming appointments, etc.  Non-urgent messages can be sent to your provider as well.   To learn more about what you can do with MyChart, go to [email or web address].    Your next appointment:   4 month(s)  The format for your next appointment:   In Person  Provider:   Thomasene Ripple, DO     Other Instructions   Adopting a Healthy Lifestyle.  Know what a healthy weight is for you (roughly BMI <25) and aim to maintain this   Aim for 7+ servings of fruits and vegetables daily   65-80+ fluid ounces of water or unsweet tea for healthy kidneys   Limit to max 1 drink of alcohol per day; avoid smoking/tobacco   Limit animal fats in diet for cholesterol and heart health - choose grass fed whenever available   Avoid highly processed foods, and foods high in saturated/trans fats   Aim for low stress - take time to unwind and care for your mental health   Aim for 150 min of moderate intensity exercise weekly for heart health, and weights twice weekly for bone health   Aim for 7-9 hours of sleep daily   When it comes to diets, agreement about the perfect plan isnt easy to find, even among  the experts. Experts at the [facility] [employer] developed an idea known as the Healthy Eating Plate. Just imagine a plate divided into logical, healthy portions.   The emphasis is on diet quality:   Load up on vegetables and fruits - one-half of your plate: Aim for color and variety, and remember that potatoes dont count.   Go for whole grains - one-quarter of your plate: Whole wheat, barley, wheat berries, quinoa, oats, brown rice, and foods made with them. If you want pasta, go with whole wheat pasta.   Protein power - one-quarter of your plate: Fish, chicken, beans, and nuts are all healthy, versatile protein sources. Limit red meat.   The diet, however, does go beyond the plate, offering a few other suggestions.   Use healthy plant oils, such as olive, canola, soy, corn, sunflower and peanut. Check the labels, and avoid partially hydrogenated oil, which have unhealthy trans fats.   If youre thirsty, drink water. Coffee and tea are good in moderation, but skip sugary drinks and limit milk and dairy products to one or two daily servings.   The type of carbohydrate in the diet is more important than the amount. Some sources of carbohydrates, such as vegetables, fruits, whole grains, and beans-are healthier than others.   Finally, stay active  Signed, Thomasene Ripple, DO  07/29/2021  7:38 PM    [facility]

## 2021-07-30 LAB — LIPID PANEL
Chol/HDL Ratio: 4.7 ratio — ABNORMAL HIGH (ref 0.0–4.4)
Cholesterol, Total: 223 mg/dL — ABNORMAL HIGH (ref 100–199)
HDL: 47 mg/dL (ref >39)
LDL Chol Calc (NIH): 159 mg/dL — ABNORMAL HIGH (ref 0–99)
Triglycerides: 97 mg/dL (ref 0–149)
VLDL Cholesterol Cal: 17 mg/dL (ref 5–40)

## 2021-07-30 LAB — HEMOGLOBIN A1C
Est. average glucose Bld gHb Est-mCnc: 126 mg/dL
Hgb A1c MFr Bld: 6 % — ABNORMAL HIGH (ref 4.8–5.6)

## 2021-07-30 LAB — VITAMIN D 25 HYDROXY (VIT D DEFICIENCY, FRACTURES): Vit D, 25-Hydroxy: 16.5 ng/mL — ABNORMAL LOW (ref 30.0–100.0)

## 2021-07-30 LAB — LIPOPROTEIN A (LPA): Lipoprotein (a): 118.9 nmol/L — ABNORMAL HIGH (ref <75.0)

## 2021-08-01 ENCOUNTER — Other Ambulatory Visit: Payer: Self-pay

## 2021-08-01 ENCOUNTER — Ambulatory Visit (INDEPENDENT_AMBULATORY_CARE_PROVIDER_SITE_OTHER): Payer: BC Managed Care – PPO

## 2021-08-01 DIAGNOSIS — R002 Palpitations: Secondary | ICD-10-CM | POA: Diagnosis not present

## 2021-08-01 MED ORDER — VITAMIN D (ERGOCALCIFEROL) 1.25 MG (50000 UNIT) PO CAPS: 50000.0000 [IU] | ORAL_CAPSULE | ORAL | 0 refills | Status: DC

## 2021-08-01 NOTE — Progress Notes (Unsigned)
7 day ZIO XT serial# [id number] applied in office from office inventory.

## 2021-08-01 NOTE — Progress Notes (Signed)
Prescription sent to pharmacy.

## 2021-08-08 ENCOUNTER — Telehealth (HOSPITAL_COMMUNITY): Payer: Self-pay | Admitting: *Deleted

## 2021-08-08 ENCOUNTER — Telehealth (HOSPITAL_COMMUNITY): Payer: Self-pay | Admitting: Emergency Medicine

## 2021-08-08 NOTE — Telephone Encounter (Signed)
Reaching out to patient to offer assistance regarding upcoming cardiac imaging study; pt verbalizes understanding of appt date/time, parking situation and where to check in, pre-test NPO status and medications ordered, and verified current allergies; name and call back number provided for further questions should they arise  Dariela Stoker RN Navigator Cardiac Imaging  Heart and Vascular [phone] office [phone] cell  Patient to take 100mg metoprolol tartrate two hours prior to cardiac CT scan.  She is aware to arrive at 10am for her 10:30am scan. 

## 2021-08-08 NOTE — Telephone Encounter (Signed)
Patient calling saying she would still like to proceed with her cardiac CT scan but would like an explanation of the purpose of testing.  Patient was put back on the schedule and was informed that I would reach out to Dr. Servando Salina to have her reach out to her. Pt verbalized understanding.  Larey Brick, RN navigator Cardiac Imaging [phone]

## 2021-08-08 NOTE — Telephone Encounter (Signed)
Pt calling wishing to postpone CCTA until shes able to discuss findings of heart monitor with Dr. Servando Salina. She is afraid shes 'jumping the gun' by having CCTA before discussion of other test results have occurred.   Please call patient back to discuss further.  Pt states she will call back when shes ready to schedule CCTA.  Rockwell Alexandria RN Navigator Cardiac Imaging [facility] [phone] Office  [phone] Cell

## 2021-08-09 ENCOUNTER — Other Ambulatory Visit (HOSPITAL_COMMUNITY): Payer: Self-pay | Admitting: Cardiology

## 2021-08-09 ENCOUNTER — Encounter (HOSPITAL_COMMUNITY): Payer: Self-pay

## 2021-08-09 ENCOUNTER — Other Ambulatory Visit (HOSPITAL_COMMUNITY): Payer: Self-pay | Admitting: *Deleted

## 2021-08-09 ENCOUNTER — Ambulatory Visit (HOSPITAL_COMMUNITY): Admission: RE | Admit: 2021-08-09 | Payer: BC Managed Care – PPO | Source: Ambulatory Visit

## 2021-08-09 ENCOUNTER — Other Ambulatory Visit (HOSPITAL_COMMUNITY): Payer: Self-pay

## 2021-08-09 ENCOUNTER — Ambulatory Visit (HOSPITAL_COMMUNITY)
Admission: RE | Admit: 2021-08-09 | Discharge: 2021-08-09 | Disposition: A | Discharge status: Home / Self Care | Payer: BC Managed Care – PPO | Source: Ambulatory Visit | Attending: Cardiology | Admitting: Cardiology

## 2021-08-09 ENCOUNTER — Other Ambulatory Visit (HOSPITAL_COMMUNITY): Payer: BC Managed Care – PPO

## 2021-08-09 ENCOUNTER — Other Ambulatory Visit: Payer: Self-pay

## 2021-08-09 DIAGNOSIS — Z789 Other specified health status: Secondary | ICD-10-CM

## 2021-08-09 DIAGNOSIS — R0789 Other chest pain: Secondary | ICD-10-CM

## 2021-08-09 MED ORDER — DILTIAZEM HCL 25 MG/5ML IV SOLN
INTRAVENOUS | Status: AC
  Filled 2021-08-09: qty 5

## 2021-08-09 MED ORDER — NITROGLYCERIN 0.4 MG SL SUBL
SUBLINGUAL_TABLET | SUBLINGUAL | Status: AC
  Filled 2021-08-09: qty 2

## 2021-08-09 MED ORDER — IVABRADINE HCL 5 MG PO TABS
ORAL_TABLET | ORAL | 0 refills | Status: AC
  Filled 2021-08-09: qty 3, 1d supply, fill #0

## 2021-08-09 MED ORDER — METOPROLOL TARTRATE 5 MG/5ML IV SOLN
INTRAVENOUS | Status: AC
  Filled 2021-08-09: qty 10

## 2021-08-09 MED ORDER — METOPROLOL TARTRATE 100 MG PO TABS
ORAL_TABLET | ORAL | 0 refills | Status: AC
  Filled 2021-08-09: qty 1, 1d supply, fill #0

## 2021-08-09 MED ORDER — METOPROLOL TARTRATE 5 MG/5ML IV SOLN: 10.0000 mg | INTRAVENOUS | Status: DC | PRN

## 2021-08-09 MED ORDER — DILTIAZEM HCL 25 MG/5ML IV SOLN: 10.0000 mg | Freq: Once | INTRAVENOUS | Status: DC

## 2021-08-17 ENCOUNTER — Other Ambulatory Visit (HOSPITAL_COMMUNITY): Payer: Self-pay

## 2021-10-27 ENCOUNTER — Encounter: Payer: Self-pay | Admitting: Cardiology

## 2021-10-27 NOTE — Telephone Encounter (Signed)
Called patient in response to MyChart message She was unable to have CT test done She wore heart monitor   She reports her insurance benefits run out end of Feb and would like to reschedule before 2/28 so she has insurance coverage   She reports Feb 20-28 would work for her    Advised will send message to primary RN to review schedule for possible work-in

## 2021-11-10 ENCOUNTER — Telehealth: Payer: Self-pay | Admitting: Cardiology

## 2021-11-10 NOTE — Telephone Encounter (Signed)
Pt called in and stated that she lost her job and cant really afford to pay $80.00 .  She want to know if this appt is really necessary?    Best number [phone]

## 2021-11-10 NOTE — Telephone Encounter (Signed)
Spoke with pt regarding appointment scheduled for tomorrow. Pt asks if she would need blood work done tomorrow, explained to pt that she had recent lab work done in November and it would be up to Dr. Harriet Masson if she decides she needs any additional blood work. Pt states that she spoke with someone prior to me that helped make sense of her appointment, however when speaking to me she is unclear of the reason for this appointment. Explained to pt that Dr. Harriet Masson was planning to discuss her recent monitor results. Pt states that she would like a concrete answer on whether or not this appointment is necessary. Pt states that she is prepared to come to appointment, advised pt that we would see her tomorrow morning at 8:40am, pt hung up the phone at this time.

## 2021-11-10 NOTE — Telephone Encounter (Signed)
Patient wants to know if she needs to have lab done tomorrow.

## 2021-11-11 ENCOUNTER — Other Ambulatory Visit: Payer: Self-pay

## 2021-11-11 ENCOUNTER — Encounter: Payer: Self-pay | Admitting: Cardiology

## 2021-11-11 ENCOUNTER — Ambulatory Visit (INDEPENDENT_AMBULATORY_CARE_PROVIDER_SITE_OTHER): Payer: BC Managed Care – PPO | Admitting: Cardiology

## 2021-11-11 VITALS — BP 124/82 | HR 94 | Ht 65.0 in | Wt 274.6 lb

## 2021-11-11 DIAGNOSIS — R7303 Prediabetes: Secondary | ICD-10-CM | POA: Insufficient documentation

## 2021-11-11 DIAGNOSIS — Z5986 Financial insecurity: Secondary | ICD-10-CM

## 2021-11-11 DIAGNOSIS — R5383 Other fatigue: Secondary | ICD-10-CM

## 2021-11-11 DIAGNOSIS — R400 Somnolence: Secondary | ICD-10-CM

## 2021-11-11 DIAGNOSIS — Z59869 Financial insecurity, unspecified: Secondary | ICD-10-CM

## 2021-11-11 DIAGNOSIS — E782 Mixed hyperlipidemia: Secondary | ICD-10-CM | POA: Diagnosis not present

## 2021-11-11 NOTE — Patient Instructions (Signed)
Medication Instructions:  Your physician recommends that you continue on your current medications as directed. Please refer to the Current Medication list given to you today.  *If you need a refill on your cardiac medications before your next appointment, please call your pharmacy*   Lab Work: None If you have labs (blood work) drawn today and your tests are completely normal, you will receive your results only by: MyChart Message (if you have MyChart) OR A paper copy in the mail If you have any lab test that is abnormal or we need to change your treatment, we will call you to review the results.   Testing/Procedures: Your physician has recommended that you have a sleep study. This test records several body functions during sleep, including: brain activity, eye movement, oxygen and carbon dioxide blood levels, heart rate and rhythm, breathing rate and rhythm, the flow of air through your mouth and nose, snoring, body muscle movements, and chest and belly movement.   Follow-Up: At [facility], you and your health needs are our priority.  As part of our continuing mission to provide you with exceptional heart care, we have created designated Provider Care Teams.  These Care Teams include your primary Cardiologist (physician) and Advanced Practice Providers (APPs -  Physician Assistants and Nurse Practitioners) who all work together to provide you with the care you need, when you need it.  We recommend signing up for the patient portal called "MyChart".  Sign up information is provided on this After Visit Summary.  MyChart is used to connect with patients for Virtual Visits (Telemedicine).  Patients are able to view lab/test results, encounter notes, upcoming appointments, etc.  Non-urgent messages can be sent to your provider as well.   To learn more about what you can do with MyChart, go to [email or web address].    Your next appointment:   1 year(s)  The format for your next  appointment:   In Person  Provider:   Thomasene Ripple, DO     Other Instructions

## 2021-11-11 NOTE — Progress Notes (Signed)
Cardiology Office Note:    Date:  11/11/2021   ID:  Vanessa Stafford, DOB 08/30/1976, MRN [id number]  PCP:  Clayborn Heronankins, Victoria R, MD  Cardiologist:  Thomasene RippleKardie Kataryna Mcquilkin, DO  Electrophysiologist:  None   Referring MD: Clayborn Heronankins, Victoria R, MD   " I am doing fine"  History of Present Illness:    Vanessa Stafford is a 46 y.o. female with a hx of hyperlipidemia, morbid obesity, prediabetes is here today for follow-up visit.  I first saw the patient in November 2022 at that time she presented because she had had an episode of chest discomfort as well as she was experiencing palpitations.  During the day given her risk factors we will plan for coronary CTA and will placed a monitor on the patient.  We repeated her blood work with lipid profile as well as LP(a) vitamin D level given the fatigue and hemoglobin A1c.  In the interim she did wear the monitor which did not show any significant arrhythmia but there was evidence of type I Mobitz second-degree AV block suggesting undiagnosed sleep apnea.  Unfortunately she went to get her coronary CT scan but her heart rate was not appropriate to complete the study. She has had some fatigue some daytime somnolence.  No recurrent chest pain.  Unfortunately the patient lost her job and is in the process of securing a new job.  She is very worried about financial insecurity with her medical bills.  I am going to set the patient up with our social worker at to evaluate for any needs that would help with the social determinants of health.  Past Medical History:  Diagnosis Date   Spinal stenosis    Worsening headaches     Past Surgical History:  Procedure Laterality Date   MOUTH SURGERY      Current Medications: Current Meds  Medication Sig   Acetaminophen (TYLENOL EXTRA STRENGTH PO) Take as needed by mouth.   rosuvastatin (CRESTOR) 5 MG tablet Take 1 tablet (5 mg total) by mouth daily.     Allergies:   Nabumetone   Social History   Socioeconomic History    Marital status: Single    Spouse name: Not on file   Number of children: 0   Years of education: 16   Highest education level: Bachelor's degree (e.g., BA, AB, BS)  Occupational History   Occupation: Furniture conservator/restorerAdministrative Support  Tobacco Use   Smoking status: Never   Smokeless tobacco: Never  Vaping Use   Vaping Use: Never used  Substance and Sexual Activity   Alcohol use: Yes    Comment: 1-2 drinks occasionally - social   Drug use: No   Sexual activity: Yes    Birth control/protection: None  Other Topics Concern   Not on file  Social History Narrative   Lives at home alone.   Left-handed.   Occasionally use of caffeine.   Social Determinants of Health   Financial Resource Strain: Not on file  Food Insecurity: Not on file  Transportation Needs: Not on file  Physical Activity: Not on file  Stress: Not on file  Social Connections: Not on file     Family History: The patient's family history includes Arthritis in her father; Cataracts in her mother; Diabetes in her mother; Hypertension in her maternal grandmother. There is no history of Breast cancer.  ROS:   Review of Systems  Constitution: Reports fatigue.  Negative for decreased appetite, fever and weight gain.  HENT: Negative for congestion, ear discharge,  hoarse voice and sore throat.   Eyes: Negative for discharge, redness, vision loss in right eye and visual halos.  Cardiovascular: Negative for chest pain, dyspnea on exertion, leg swelling, orthopnea and palpitations.  Respiratory: Negative for cough, hemoptysis, shortness of breath and snoring.   Endocrine: Negative for heat intolerance and polyphagia.  Hematologic/Lymphatic: Negative for bleeding problem. Does not bruise/bleed easily.  Skin: Negative for flushing, nail changes, rash and suspicious lesions.  Musculoskeletal: Negative for arthritis, joint pain, muscle cramps, myalgias, neck pain and stiffness.  Gastrointestinal: Negative for abdominal pain, bowel  incontinence, diarrhea and excessive appetite.  Genitourinary: Negative for decreased libido, genital sores and incomplete emptying.  Neurological: Negative for brief paralysis, focal weakness, headaches and loss of balance.  Psychiatric/Behavioral: Negative for altered mental status, depression and suicidal ideas.  Allergic/Immunologic: Negative for HIV exposure and persistent infections.    EKGs/Labs/Other Studies Reviewed:    The following studies were reviewed today:   EKG: None today  ZIO monitor Patch Wear Time:  6 days and 19 hours starting August 01, 2021 Indication: Palpitations   Patient had a min HR of 40 bpm, max HR of 165 bpm, and avg HR of 93 bpm. Predominant underlying rhythm was Sinus Rhythm.  Second Degree AV Block-Mobitz I (Wenckebach) was present, occurred during the overnight hours raising suspicion for obstructive sleep apnea.   Premature atrial complexes were rare (<1.0%). Premature ventricular complexes were rare (<1.0%).   Symptoms associated with sinus tachycardia.   No atrial fibrillation, no ventricular tachycardia, no pathological pauses.   Conclusion: This study is remarkable for second-degree AV block Mobitz 1 which is nonpathological and occurring during overnight hours suggesting undiagnosed sleep apnea.  Recent Labs: No results found for requested labs within last 8760 hours.  Recent Lipid Panel    Component Value Date/Time   CHOL 223 (H) 07/29/2021 0932   TRIG 97 07/29/2021 0932   HDL 47 07/29/2021 0932   CHOLHDL 4.7 (H) 07/29/2021 0932   LDLCALC 159 (H) 07/29/2021 0932    Physical Exam:    VS:  BP 124/82    Pulse 94    Ht 5\' 5"  (1.651 m)    Wt 274 lb 9.6 oz (124.6 kg)    LMP  (Within Weeks)    SpO2 94%    BMI 45.70 kg/m     Wt Readings from Last 3 Encounters:  11/11/21 274 lb 9.6 oz (124.6 kg)  07/29/21 278 lb (126.1 kg)  10/29/17 255 lb (115.7 kg)     GEN: Well nourished, well developed in no acute distress HEENT:  Normal NECK: No JVD; No carotid bruits LYMPHATICS: No lymphadenopathy CARDIAC: S1S2 noted,RRR, no murmurs, rubs, gallops RESPIRATORY:  Clear to auscultation without rales, wheezing or rhonchi  ABDOMEN: Soft, non-tender, non-distended, +bowel sounds, no guarding. EXTREMITIES: No edema, No cyanosis, no clubbing MUSCULOSKELETAL:  No deformity  SKIN: Warm and dry NEUROLOGIC:  Alert and oriented x 3, non-focal PSYCHIATRIC:  Normal affect, good insight  ASSESSMENT:    1. Fatigue, unspecified type   2. Mixed hyperlipidemia   3. Prediabetes   4. Morbid obesity (HCC)   5. Daytime somnolence   6. Financial insecurity    PLAN:     Fatigue-with her monitor showing concern for type I second-degree AV block as well as daytime somnolence I am concerned for undiagnosed sleep apnea in this patient.  Her STOP-BANG score is 3 suggesting intermediate risk for obstructive sleep apnea.  I think will be beneficial to the patient to get  a sleep study.  Prediabetes-diet modification with advised on increase exercise performed today.  Morbid obesity-the patient understands the need to lose weight with diet and exercise. We have discussed specific strategies for this.  Financial insecurities affecting medical care will refer the patient to our care navigation team.  She has not had any recurrent chest pain and prefers to hold off on further testing at this time.  I will think this is unreasonable or reassess her symptoms and need for further testing.  The patient is in agreement with the above plan. The patient left the office in stable condition.  The patient will follow up in   Medication Adjustments/Labs and Tests Ordered: Current medicines are reviewed at length with the patient today.  Concerns regarding medicines are outlined above.  Orders Placed This Encounter  Procedures   Referral to HRT/VAS Care Navigation   Split night study   No orders of the defined types were placed in this  encounter.   Patient Instructions  Medication Instructions:  Your physician recommends that you continue on your current medications as directed. Please refer to the Current Medication list given to you today.  *If you need a refill on your cardiac medications before your next appointment, please call your pharmacy*   Lab Work: None If you have labs (blood work) drawn today and your tests are completely normal, you will receive your results only by: MyChart Message (if you have MyChart) OR A paper copy in the mail If you have any lab test that is abnormal or we need to change your treatment, we will call you to review the results.   Testing/Procedures: Your physician has recommended that you have a sleep study. This test records several body functions during sleep, including: brain activity, eye movement, oxygen and carbon dioxide blood levels, heart rate and rhythm, breathing rate and rhythm, the flow of air through your mouth and nose, snoring, body muscle movements, and chest and belly movement.   Follow-Up: At [facility], you and your health needs are our priority.  As part of our continuing mission to provide you with exceptional heart care, we have created designated Provider Care Teams.  These Care Teams include your primary Cardiologist (physician) and Advanced Practice Providers (APPs -  Physician Assistants and Nurse Practitioners) who all work together to provide you with the care you need, when you need it.  We recommend signing up for the patient portal called "MyChart".  Sign up information is provided on this After Visit Summary.  MyChart is used to connect with patients for Virtual Visits (Telemedicine).  Patients are able to view lab/test results, encounter notes, upcoming appointments, etc.  Non-urgent messages can be sent to your provider as well.   To learn more about what you can do with MyChart, go to [email or web address].    Your next appointment:   1  year(s)  The format for your next appointment:   In Person  Provider:   Thomasene Ripple, DO     Other Instructions     Adopting a Healthy Lifestyle.  Know what a healthy weight is for you (roughly BMI <25) and aim to maintain this   Aim for 7+ servings of fruits and vegetables daily   65-80+ fluid ounces of water or unsweet tea for healthy kidneys   Limit to max 1 drink of alcohol per day; avoid smoking/tobacco   Limit animal fats in diet for cholesterol and heart health - choose grass fed whenever available   Avoid  highly processed foods, and foods high in saturated/trans fats   Aim for low stress - take time to unwind and care for your mental health   Aim for 150 min of moderate intensity exercise weekly for heart health, and weights twice weekly for bone health   Aim for 7-9 hours of sleep daily   When it comes to diets, agreement about the perfect plan isnt easy to find, even among the experts. Experts at the [facility] of [employer] developed an idea known as the Healthy Eating Plate. Just imagine a plate divided into logical, healthy portions.   The emphasis is on diet quality:   Load up on vegetables and fruits - one-half of your plate: Aim for color and variety, and remember that potatoes dont count.   Go for whole grains - one-quarter of your plate: Whole wheat, barley, wheat berries, quinoa, oats, brown rice, and foods made with them. If you want pasta, go with whole wheat pasta.   Protein power - one-quarter of your plate: Fish, chicken, beans, and nuts are all healthy, versatile protein sources. Limit red meat.   The diet, however, does go beyond the plate, offering a few other suggestions.   Use healthy plant oils, such as olive, canola, soy, corn, sunflower and peanut. Check the labels, and avoid partially hydrogenated oil, which have unhealthy trans fats.   If youre thirsty, drink water. Coffee and tea are good in moderation, but skip sugary drinks  and limit milk and dairy products to one or two daily servings.   The type of carbohydrate in the diet is more important than the amount. Some sources of carbohydrates, such as vegetables, fruits, whole grains, and beans-are healthier than others.   Finally, stay active  Signed, Thomasene Ripple, DO  11/11/2021 8:31 PM    [facility]

## 2021-11-16 ENCOUNTER — Ambulatory Visit: Payer: BC Managed Care – PPO | Admitting: Cardiology

## 2021-11-16 ENCOUNTER — Telehealth: Payer: Self-pay | Admitting: Licensed Clinical Social Worker

## 2021-11-16 NOTE — Progress Notes (Signed)
?Heart and Vascular Care Navigation ? ?11/16/2021- LATE ENTRY   *2/24* ? ?Matthias Hughs ?05-Feb-1976 ?[id number] ? ?Reason for Referral:  ?Lost job/lack of insurance moving forward ?Engaged with patient by telephone for initial visit for Heart and Vascular Care Coordination. ?                                                                                                  ?Assessment: LATE ENTRY   *2/24* ?LCSW called and was able to reach pt via telephone at [phone]. Introduced self, role, reason for call. Pt confirmed home address, emergency contact and PCP. Pt shares that she lost her job in January and has been made aware that her current coverage will lapse at the end of this month. She is understandably concerned by the finances of ongoing medical work up. LCSW shared multiple options with pt including the [employer] as she may be eligible for a special enrollment period due to job loss, pt also may be eligible for [employer] and [employer]. Pt PCP also leaving practice, I will mail her additional PCP options should she be interested in another practice. Pt encouraged to reach out to me with any additional questions.                                  ? ?HRT/VAS Care Coordination   ? ? Patients Home Cardiology Office [facility]  ? Outpatient Care Team Social Worker  ? Social Worker Name: Margarito Liner [facility] [phone]  ? Living arrangements for the past 2 months Apartment  ? Lives with: Self  ? Patient Current Insurance Coverage Self-Pay  ? Patient Has Concern With Paying Medical Bills Yes  ? Patient Concerns With Medical Bills ongoing medical care, no coverage  ? Medical Bill ReferralsTheatre manager for [employer], [employer] and [employer] both mailed to pt if not eligible for [employer]  ? Does Patient Have Prescription Coverage? No  ? ?  ? ? ?Social History:                                                                              ?SDOH Screenings  ? ?Alcohol Screen: Not on file  ?Depression (PHQ2-9): Not on file  ?Financial Resource Strain: Medium Risk  ? Difficulty of Paying Living Expenses: Somewhat hard  ?Food Insecurity: Not on file  ?Housing: Not on file  ?Physical Activity: Not on file  ?Social Connections: Not on file  ?Stress: Not on file  ?Tobacco Use: Low Risk   ? Smoking Tobacco Use: Never  ? Smokeless Tobacco Use: Never  ? Passive Exposure: Not on file  ?Transportation Needs: No Transportation Needs  ? Lack of Transportation (Medical): No  ?  Lack of Transportation (Non-Medical): No  ? ? ?SDOH Interventions: ?Financial Resources:  Financial Strain Interventions: Other (Comment), Development worker, community (referral for [employer] and [employer] as well as Primary school teacher) ?Financial Counseling for [employer] [facility]  ?Transportation:   Transportation Interventions: Intervention Not Indicated  ? ? ?Other Care Navigation Interventions:    ?Provided Pharmacy assistance resources  [facility] may be able to assist  ? ?Follow-up plan:   ?LCSW mailed the following to pt- my contact card, Garment/textile technologist, PCP list,  [employer]. I will follow up unless pt reaches out to me before then. Remain available.  ? ? ? ?

## 2021-12-16 ENCOUNTER — Encounter (HOSPITAL_BASED_OUTPATIENT_CLINIC_OR_DEPARTMENT_OTHER): Payer: BC Managed Care – PPO | Admitting: Cardiovascular Disease

## 2022-01-10 ENCOUNTER — Other Ambulatory Visit: Payer: Self-pay | Admitting: Family Medicine

## 2022-01-10 DIAGNOSIS — Z1231 Encounter for screening mammogram for malignant neoplasm of breast: Secondary | ICD-10-CM

## 2022-01-18 ENCOUNTER — Ambulatory Visit
Admission: RE | Admit: 2022-01-18 | Discharge: 2022-01-18 | Disposition: A | Discharge status: Home / Self Care | Payer: PRIVATE HEALTH INSURANCE | Source: Ambulatory Visit | Attending: Family Medicine | Admitting: Family Medicine

## 2022-01-18 DIAGNOSIS — Z1231 Encounter for screening mammogram for malignant neoplasm of breast: Secondary | ICD-10-CM

## 2022-08-23 ENCOUNTER — Other Ambulatory Visit: Payer: Self-pay | Admitting: Cardiology

## 2022-12-10 ENCOUNTER — Emergency Department (HOSPITAL_BASED_OUTPATIENT_CLINIC_OR_DEPARTMENT_OTHER)
Admission: EM | Admit: 2022-12-10 | Discharge: 2022-12-10 | Disposition: A | Discharge status: Home / Self Care | Payer: PRIVATE HEALTH INSURANCE | Attending: Emergency Medicine | Admitting: Emergency Medicine

## 2022-12-10 ENCOUNTER — Emergency Department (HOSPITAL_BASED_OUTPATIENT_CLINIC_OR_DEPARTMENT_OTHER): Payer: PRIVATE HEALTH INSURANCE

## 2022-12-10 ENCOUNTER — Other Ambulatory Visit: Payer: Self-pay

## 2022-12-10 DIAGNOSIS — M546 Pain in thoracic spine: Secondary | ICD-10-CM | POA: Insufficient documentation

## 2022-12-10 DIAGNOSIS — R059 Cough, unspecified: Secondary | ICD-10-CM | POA: Diagnosis not present

## 2022-12-10 DIAGNOSIS — R067 Sneezing: Secondary | ICD-10-CM | POA: Insufficient documentation

## 2022-12-10 DIAGNOSIS — M25512 Pain in left shoulder: Secondary | ICD-10-CM | POA: Insufficient documentation

## 2022-12-10 LAB — BASIC METABOLIC PANEL
Anion gap: 8 (ref 5–15)
BUN: 9 mg/dL (ref 6–20)
CO2: 23 mmol/L (ref 22–32)
Calcium: 8.6 mg/dL — ABNORMAL LOW (ref 8.9–10.3)
Chloride: 104 mmol/L (ref 98–111)
Creatinine, Ser: 0.94 mg/dL (ref 0.44–1.00)
GFR, Estimated: >60 mL/min (ref >60)
Glucose, Bld: 89 mg/dL (ref 70–99)
Potassium: 3.6 mmol/L (ref 3.5–5.1)
Sodium: 135 mmol/L (ref 135–145)

## 2022-12-10 LAB — CBC
HCT: 41.8 % (ref 36.0–46.0)
Hemoglobin: 13.4 g/dL (ref 12.0–15.0)
MCH: 28.3 pg (ref 26.0–34.0)
MCHC: 32.1 g/dL (ref 30.0–36.0)
MCV: 88.4 fL (ref 80.0–100.0)
Platelets: 358 10*3/uL (ref 150–400)
RBC: 4.73 MIL/uL (ref 3.87–5.11)
RDW: 13.3 % (ref 11.5–15.5)
WBC: 5.5 10*3/uL (ref 4.0–10.5)
nRBC: 0 % (ref 0.0–0.2)

## 2022-12-10 LAB — TROPONIN I (HIGH SENSITIVITY): Troponin I (High Sensitivity): <2 ng/L (ref <18)

## 2022-12-10 MED ORDER — METHOCARBAMOL 500 MG PO TABS: 500.0000 mg | ORAL_TABLET | Freq: Three times a day (TID) | ORAL | 0 refills | Status: AC | PRN

## 2022-12-10 MED ORDER — METHOCARBAMOL 500 MG PO TABS: 500.0000 mg | ORAL_TABLET | Freq: Three times a day (TID) | ORAL | 0 refills | Status: DC | PRN

## 2022-12-10 MED ORDER — DICLOFENAC SODIUM 1 % EX GEL: 2.0000 g | Freq: Four times a day (QID) | CUTANEOUS | 0 refills | Status: DC | PRN

## 2022-12-10 MED ORDER — DICLOFENAC SODIUM 1 % EX GEL: 2.0000 g | Freq: Four times a day (QID) | CUTANEOUS | 0 refills | Status: AC | PRN

## 2022-12-10 NOTE — ED Provider Notes (Signed)
Emergency Department Provider Note   I have reviewed the triage vital signs and the nursing notes.   HISTORY  Chief Complaint Shoulder Pain and Arm Pain   HPI Vanessa Stafford is a 47 y.o. female presents emergency department for evaluation of left posterior shoulder pain radiating to the neck and left arm for the past 6 days.  Pain has been fairly constant but intermittently worse.  Pain made worse with coughing/sneezing.  No anterior chest discomfort or shortness of breath.  Occasionally, pain will shoot down the arm. No numbness.    Past Medical History:  Diagnosis Date   Spinal stenosis    Worsening headaches     Review of Systems  Constitutional: No fever/chills Cardiovascular: Denies chest pain. Positive left shoulder/arm pain.  Respiratory: Denies shortness of breath. Gastrointestinal: No abdominal pain.  No nausea, no vomiting.  No diarrhea.  No constipation. Genitourinary: Negative for dysuria. Musculoskeletal: Negative for back pain. Positive left shoulder pain and arm pain.  Skin: Negative for rash. Neurological: Negative for headaches.  ____________________________________________   PHYSICAL EXAM:  VITAL SIGNS: ED Triage Vitals  Enc Vitals Group     BP 12/10/22 1234 (!) 156/71     Pulse Rate 12/10/22 1234 92     Resp 12/10/22 1234 16     Temp 12/10/22 1234 97.7 F (36.5 C)     Temp Source 12/10/22 1234 Oral     SpO2 12/10/22 1234 100 %     Weight 12/10/22 1243 273 lb (123.8 kg)     Height 12/10/22 1243 5\' 4"  (1.626 m)   Constitutional: Alert and oriented. Well appearing and in no acute distress. Eyes: Conjunctivae are normal.  Head: Atraumatic. Nose: No congestion/rhinnorhea. Mouth/Throat: Mucous membranes are moist.   Neck: No stridor.   Cardiovascular: Normal rate, regular rhythm. Good peripheral circulation. Grossly normal heart sounds.   Respiratory: Normal respiratory effort.  No retractions. Lungs CTAB. Gastrointestinal: Soft and  nontender. No distention.  Musculoskeletal: No lower extremity tenderness nor edema. No gross deformities of extremities. Neurologic:  Normal speech and language. No gross focal neurologic deficits are appreciated.  Skin:  Skin is warm, dry and intact. No rash noted.  ____________________________________________   LABS (all labs ordered are listed, but only abnormal results are displayed)  Labs Reviewed  BASIC METABOLIC PANEL - Abnormal; Notable for the following components:      Result Value   Calcium 8.6 (*)    All other components within normal limits  CBC  TROPONIN I (HIGH SENSITIVITY)   ____________________________________________  EKG   EKG Interpretation  Date/Time:  Sunday December 10 2022 12:40:10 EDT Ventricular Rate:  82 PR Interval:  149 QRS Duration: 84 QT Interval:  362 QTC Calculation: 423 R Axis:   53 Text Interpretation: Sinus rhythm Low voltage, precordial leads Baseline wander in lead(s) II III aVF V3 Confirmed by Nanda Quinton [phone]) on 12/10/2022 12:53:06 PM        ____________________________________________  RADIOLOGY  DG Chest Portable 1 View  Result Date: 12/10/2022 CLINICAL DATA:  Left upper back pain. EXAM: PORTABLE CHEST 1 VIEW COMPARISON:  None Available. FINDINGS: The heart size and mediastinal contours are within normal limits. Both lungs are clear. The visualized skeletal structures are unremarkable. IMPRESSION: No active disease. Electronically Signed   By: Misty Stanley M.D.   On: 12/10/2022 12:53    ____________________________________________   PROCEDURES  Procedure(s) performed:   Procedures  None  ____________________________________________   INITIAL IMPRESSION / ASSESSMENT AND PLAN /  ED COURSE  Pertinent labs & imaging results that were available during my care of the patient were reviewed by me and considered in my medical decision making (see chart for details).   This patient is Presenting for Evaluation of CP, which  does require a range of treatment options, and is a complaint that involves a high risk of morbidity and mortality.  The Differential Diagnoses includes but is not exclusive to acute coronary syndrome, aortic dissection, pulmonary embolism, cardiac tamponade, community-acquired pneumonia, pericarditis, musculoskeletal chest wall pain, etc.    Clinical Laboratory Tests Ordered, included troponin negative.  Do not plan to repeat troponin with several days of constant pain.  No acute kidney injury.  CBC without leukocytosis or anemia.  Radiologic Tests Ordered, included CXR. I independently interpreted the images and agree with radiology interpretation.   Cardiac Monitor Tracing which shows NSR.    Social Determinants of Health Risk patient is a non-smoker.   Medical Decision Making: Summary:  Patient presents emergency department with pain mainly in the left shoulder.  Clinically seems more radicular in nature radiating to the neck and left arm.  Considered alternate diagnoses such as ACS, PE as above. Troponin normal and EKG reassuring.   Reevaluation with update and discussion with patient regarding the results and imaging findings.  Plan for MSK relaxer and PCP follow up along with strict ED return precautions.   Patient's presentation is most consistent with acute presentation with potential threat to life or bodily function.   Disposition: discharge  ____________________________________________  FINAL CLINICAL IMPRESSION(S) / ED DIAGNOSES  Final diagnoses:  Acute pain of left shoulder    Note:  This document was prepared using Dragon voice recognition software and may include unintentional dictation errors.  Nanda Quinton, MD, [facility] Emergency Medicine    Jamarria Real, [facility], MD 12/11/22 1200

## 2022-12-10 NOTE — Discharge Instructions (Signed)

## 2022-12-10 NOTE — ED Triage Notes (Signed)
Pt reports L shoulder and back pain for 6 days. Pain radiates to L arm but is intermittent. Pt states pain is more in shoulder blade area. No limited mobility. Reports occasional chest pain and SHOB over the last week.

## 2023-01-17 ENCOUNTER — Telehealth: Payer: Self-pay

## 2023-01-17 NOTE — Telephone Encounter (Signed)
Patient telephoned requesting [facility]CP and mammogram scholarship information. Patient will call back if she decides to continue screening process.

## 2023-05-17 ENCOUNTER — Other Ambulatory Visit: Payer: Self-pay | Admitting: Family Medicine

## 2023-05-17 DIAGNOSIS — Z1231 Encounter for screening mammogram for malignant neoplasm of breast: Secondary | ICD-10-CM

## 2023-05-24 ENCOUNTER — Ambulatory Visit
Admission: RE | Admit: 2023-05-24 | Discharge: 2023-05-24 | Disposition: A | Discharge status: Home / Self Care | Payer: 59 | Source: Ambulatory Visit | Attending: Family Medicine | Admitting: Family Medicine

## 2023-05-24 DIAGNOSIS — Z1231 Encounter for screening mammogram for malignant neoplasm of breast: Secondary | ICD-10-CM

## 2024-03-30 IMAGING — MG MM DIGITAL SCREENING BILAT W/ TOMO AND CAD
8 series · 8 of 24 positions shown · non-contrast
Comparison: Previous exam(s).

CLINICAL DATA: Screening.

EXAM:
DIGITAL SCREENING BILATERAL MAMMOGRAM WITH TOMOSYNTHESIS AND CAD
TECHNIQUE: Bilateral screening digital craniocaudal and mediolateral oblique
mammograms were obtained. Bilateral screening digital breast
tomosynthesis was performed. The images were evaluated with
computer-aided detection.

[R MLO synth-2D]
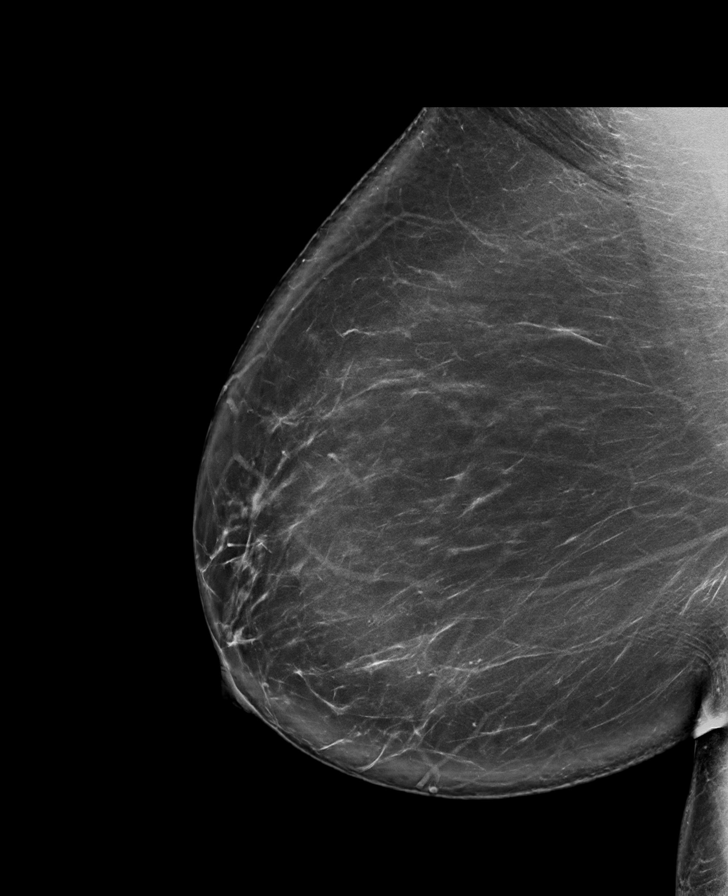

[R CC synth-2D]
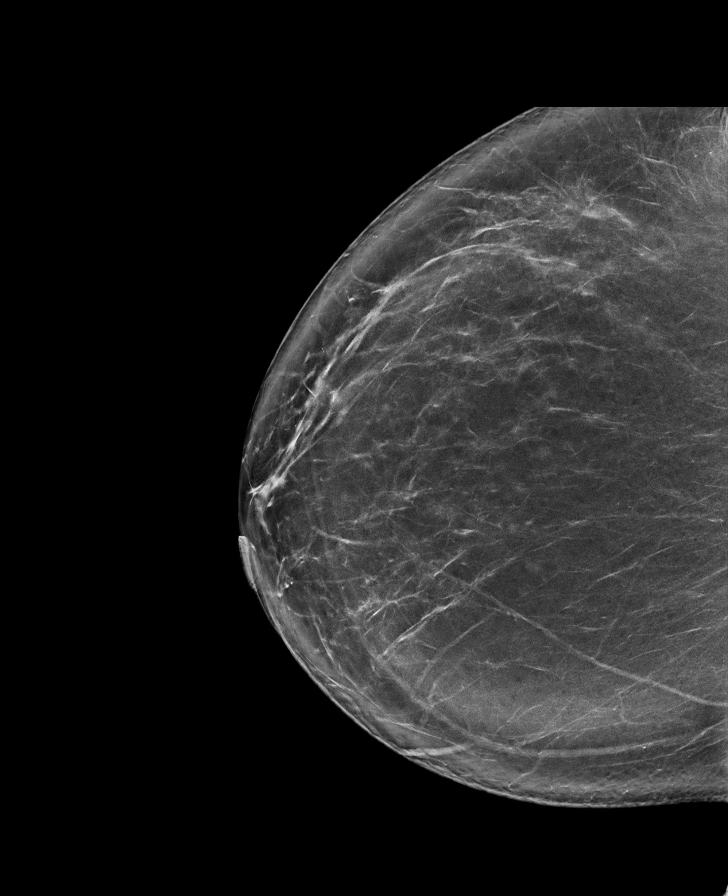

[L MLO synth-2D]
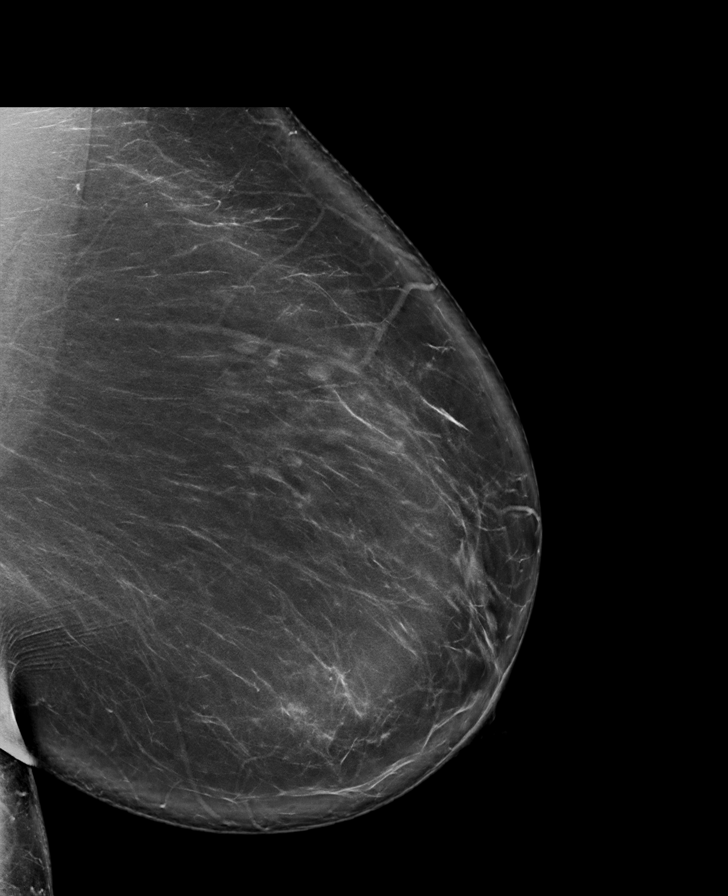

[L CC synth-2D]
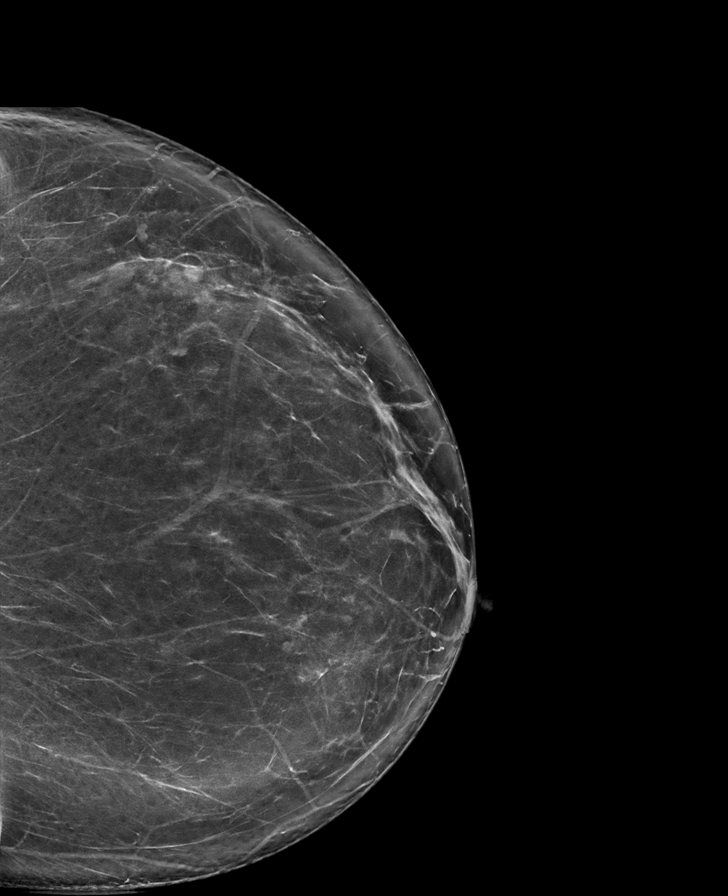

[L CC tomo · tomo slice 45/90.0]
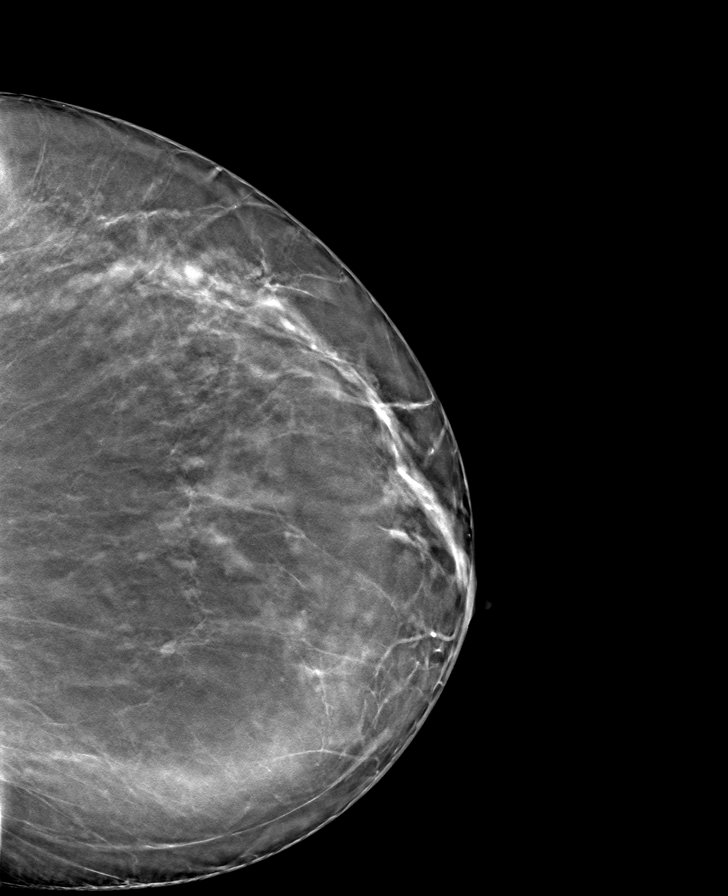

[R CC tomo · tomo slice 47/92.0]
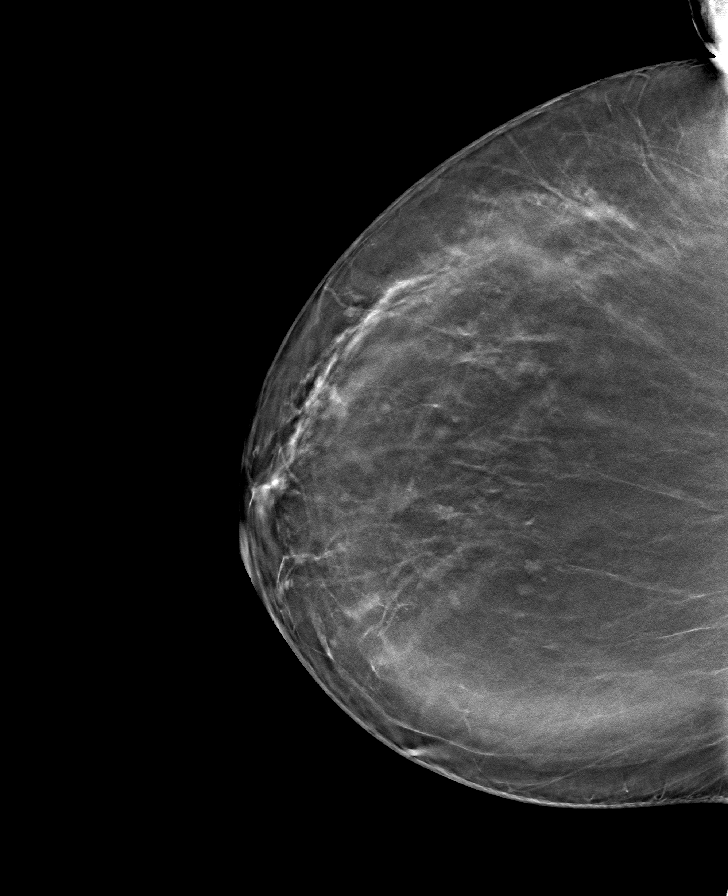

[L MLO tomo · tomo slice 53/105.0]
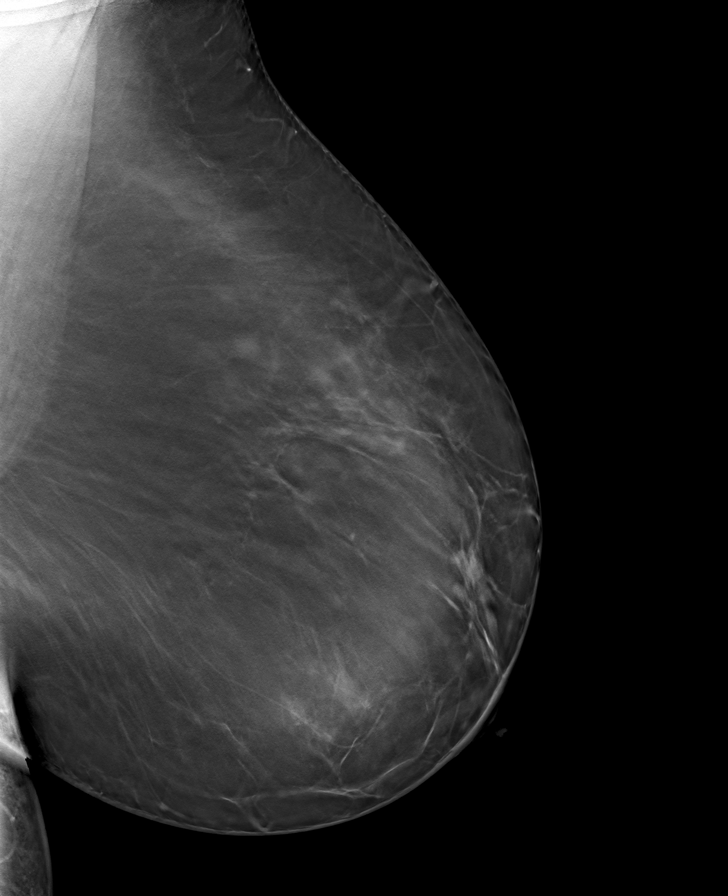

[R MLO tomo · tomo slice 54/107.0]
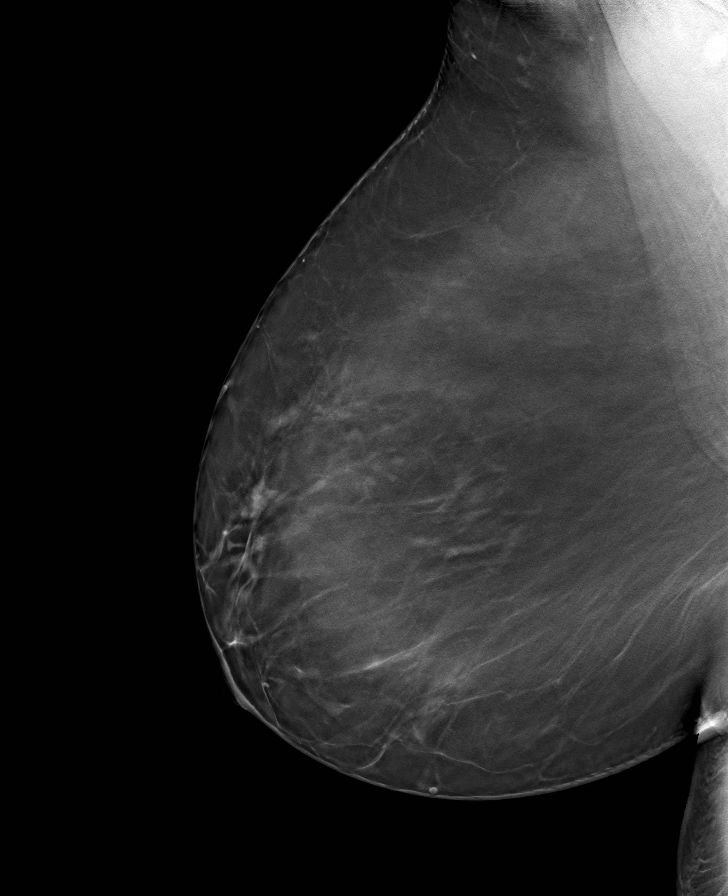

[8 of 24 positions shown; findings below may reference images not displayed]

ACR Breast Density Category b: There are scattered areas of
fibroglandular density.
FINDINGS: There are no findings suspicious for malignancy.
IMPRESSION: No mammographic evidence of malignancy. A result letter of this
screening mammogram will be mailed directly to the patient.

RECOMMENDATION:
Screening mammogram in one year. (Code:51-O-LD2)

BI-RADS CATEGORY  1: Negative.
